# Patient Record
Sex: Female | Born: 1945 | Race: White | Hispanic: No | Marital: Married | Smoking: Never smoker
Health system: Southern US, Community
[De-identification: ages and names within clinical notes are randomized; demographics above are authoritative.]

## PROBLEM LIST (undated history)

## (undated) DIAGNOSIS — F419 Anxiety disorder, unspecified: Secondary | ICD-10-CM

## (undated) DIAGNOSIS — F329 Major depressive disorder, single episode, unspecified: Secondary | ICD-10-CM

## (undated) DIAGNOSIS — K589 Irritable bowel syndrome without diarrhea: Secondary | ICD-10-CM

## (undated) DIAGNOSIS — E041 Nontoxic single thyroid nodule: Secondary | ICD-10-CM

## (undated) DIAGNOSIS — D6800 Von Willebrand disease, unspecified: Secondary | ICD-10-CM

## (undated) DIAGNOSIS — K5790 Diverticulosis of intestine, part unspecified, without perforation or abscess without bleeding: Secondary | ICD-10-CM

## (undated) DIAGNOSIS — R51 Headache: Secondary | ICD-10-CM

## (undated) DIAGNOSIS — J309 Allergic rhinitis, unspecified: Secondary | ICD-10-CM

## (undated) DIAGNOSIS — I341 Nonrheumatic mitral (valve) prolapse: Secondary | ICD-10-CM

## (undated) DIAGNOSIS — K648 Other hemorrhoids: Secondary | ICD-10-CM

## (undated) DIAGNOSIS — M1990 Unspecified osteoarthritis, unspecified site: Secondary | ICD-10-CM

## (undated) DIAGNOSIS — F32A Depression, unspecified: Secondary | ICD-10-CM

## (undated) HISTORY — DX: Diverticulosis of intestine, part unspecified, without perforation or abscess without bleeding: K57.90

## (undated) HISTORY — DX: Von Willebrand disease, unspecified: D68.00

## (undated) HISTORY — DX: Headache: R51

## (undated) HISTORY — DX: Anxiety disorder, unspecified: F41.9

## (undated) HISTORY — DX: Nonrheumatic mitral (valve) prolapse: I34.1

## (undated) HISTORY — DX: Depression, unspecified: F32.A

## (undated) HISTORY — DX: Major depressive disorder, single episode, unspecified: F32.9

## (undated) HISTORY — DX: Irritable bowel syndrome, unspecified: K58.9

## (undated) HISTORY — DX: Von Willebrand's disease: D68.0

## (undated) HISTORY — DX: Unspecified osteoarthritis, unspecified site: M19.90

## (undated) HISTORY — DX: Nontoxic single thyroid nodule: E04.1

## (undated) HISTORY — DX: Other hemorrhoids: K64.8

## (undated) HISTORY — DX: Allergic rhinitis, unspecified: J30.9

---

## 1966-07-24 HISTORY — PX: TRANSRECTAL DRAINAGE OF PELVIC ABSCESS: SUR1387

## 1968-07-24 HISTORY — PX: OTHER SURGICAL HISTORY: SHX169

## 1984-07-24 HISTORY — PX: OTHER SURGICAL HISTORY: SHX169

## 1990-07-24 HISTORY — PX: ABDOMINAL HYSTERECTOMY: SHX81

## 1999-02-01 ENCOUNTER — Encounter: Admission: RE | Admit: 1999-02-01 | Discharge: 1999-02-24 | Payer: Self-pay | Admitting: Family Medicine

## 1999-06-01 ENCOUNTER — Encounter: Payer: Self-pay | Admitting: Family Medicine

## 1999-06-01 ENCOUNTER — Encounter: Admission: RE | Admit: 1999-06-01 | Discharge: 1999-06-01 | Payer: Self-pay | Admitting: Internal Medicine

## 2000-10-29 ENCOUNTER — Other Ambulatory Visit: Admission: RE | Admit: 2000-10-29 | Discharge: 2000-10-29 | Payer: Self-pay | Admitting: Obstetrics and Gynecology

## 2000-10-29 ENCOUNTER — Encounter: Payer: Self-pay | Admitting: Obstetrics and Gynecology

## 2000-10-29 ENCOUNTER — Encounter: Admission: RE | Admit: 2000-10-29 | Discharge: 2000-10-29 | Payer: Self-pay | Admitting: Obstetrics and Gynecology

## 2000-11-09 ENCOUNTER — Encounter: Payer: Self-pay | Admitting: Family Medicine

## 2000-11-09 ENCOUNTER — Encounter: Admission: RE | Admit: 2000-11-09 | Discharge: 2000-11-09 | Payer: Self-pay | Admitting: Family Medicine

## 2001-05-29 ENCOUNTER — Encounter: Admission: RE | Admit: 2001-05-29 | Discharge: 2001-05-29 | Payer: Self-pay | Admitting: Family Medicine

## 2001-05-29 ENCOUNTER — Encounter: Payer: Self-pay | Admitting: Family Medicine

## 2002-04-04 ENCOUNTER — Encounter: Payer: Self-pay | Admitting: Otolaryngology

## 2002-04-04 ENCOUNTER — Ambulatory Visit (HOSPITAL_COMMUNITY): Admission: RE | Admit: 2002-04-04 | Discharge: 2002-04-05 | Payer: Self-pay | Admitting: Otolaryngology

## 2002-04-07 ENCOUNTER — Ambulatory Visit (HOSPITAL_COMMUNITY): Admission: RE | Admit: 2002-04-07 | Discharge: 2002-04-07 | Payer: Self-pay | Admitting: Otolaryngology

## 2003-10-11 ENCOUNTER — Emergency Department (HOSPITAL_COMMUNITY): Admission: EM | Admit: 2003-10-11 | Discharge: 2003-10-11 | Payer: Self-pay | Admitting: Emergency Medicine

## 2004-04-12 ENCOUNTER — Encounter: Admission: RE | Admit: 2004-04-12 | Discharge: 2004-04-12 | Payer: Self-pay | Admitting: Family Medicine

## 2005-07-26 ENCOUNTER — Encounter: Admission: RE | Admit: 2005-07-26 | Discharge: 2005-07-26 | Payer: Self-pay | Admitting: Family Medicine

## 2005-07-27 ENCOUNTER — Emergency Department (HOSPITAL_COMMUNITY): Admission: EM | Admit: 2005-07-27 | Discharge: 2005-07-27 | Payer: Self-pay | Admitting: Family Medicine

## 2007-10-24 ENCOUNTER — Emergency Department (HOSPITAL_COMMUNITY): Admission: EM | Admit: 2007-10-24 | Discharge: 2007-10-24 | Payer: Self-pay | Admitting: Emergency Medicine

## 2008-06-04 ENCOUNTER — Emergency Department (HOSPITAL_COMMUNITY): Admission: EM | Admit: 2008-06-04 | Discharge: 2008-06-04 | Payer: Self-pay | Admitting: Emergency Medicine

## 2010-08-14 ENCOUNTER — Encounter: Payer: Self-pay | Admitting: Family Medicine

## 2010-08-23 ENCOUNTER — Other Ambulatory Visit: Payer: Self-pay | Admitting: Internal Medicine

## 2010-08-23 DIAGNOSIS — Z1239 Encounter for other screening for malignant neoplasm of breast: Secondary | ICD-10-CM

## 2010-08-30 ENCOUNTER — Ambulatory Visit
Admission: RE | Admit: 2010-08-30 | Discharge: 2010-08-30 | Disposition: A | Discharge status: Home / Self Care | Payer: Medicare Other | Source: Ambulatory Visit | Attending: Internal Medicine | Admitting: Internal Medicine

## 2010-08-30 DIAGNOSIS — Z1239 Encounter for other screening for malignant neoplasm of breast: Secondary | ICD-10-CM

## 2010-12-09 NOTE — Op Note (Signed)
NAME:  Teresa Huber, Teresa Huber                        ACCOUNT NO.:  [id number]   MEDICAL RECORD NO.:  [id number]                   PATIENT TYPE:  OIB   LOCATION:  5731                                 FACILITY:  [facility]   PHYSICIAN:  Dorna Leitz, M.D.                 DATE OF BIRTH:  August 01, 1945   DATE OF PROCEDURE:  04/04/2002  DATE OF DISCHARGE:                                 OPERATIVE REPORT   PREOPERATIVE DIAGNOSIS:  Bilateral inferior turbinate hypertrophy.   POSTOPERATIVE DIAGNOSIS:  Bilateral inferior turbinate hypertrophy.   PROCEDURE:  Bilateral inferior turbinate reduction.   SURGEON:  Dorna Leitz, M.D.   ANESTHESIA:  General endotracheal anesthesia.   ESTIMATED BLOOD LOSS:  Less than 10 cc.   SPECIMENS:  None.   COMPLICATIONS:  None.   INDICATIONS:  This patient is a 65 year old female with mild von  Willebrand's disease.  She has failed medical therapy and has persistent  nasal obstruction.  She has been evaluated by Dr. Myna Hidalgo who recommended  DDAVP 30 minutes prior to the procedure, as well as the pack pulling.   FINDINGS:  This patient was noted to have profuse bilateral mucosal and bony  turbinate hypertrophy with nasal obstruction.  There was a mild right  prominent maxillary crest and thickening of the nasal septum which was not  obstructing after the turbinates were reduced.   PROCEDURE:  The patient was taken to the operating room and placed on the  table in the supine position.  She was then placed under general  endotracheal anesthesia and each of the nasal cavities decongested with  Afrin on Cottonoid pledgets.  Each of the inferior turbinates were injected  with 1% lidocaine with 1:100,000 of epinephrine and time allowed for  hemostasis.  At this point, the 0-degree Storz-Hopkins endoscope was used to  perform the turbinectomy.  The microdebrider was used to remove excessive  mucosa.  Redundant bone was taken down using the endoscopic sinus scissors  along the full extent of the inferior turbinate.  Suction cautery was used  to insure  hemostasis.  The intranasal cavities were left patent and the packed with  Merocele packs coated with Bactroban Cream.  The oral cavity was suctioned  and the patient awakened from anesthesia.  She was taken to the post-  anesthesia care unit in stable condition.  There were no complications.                                               Dorna Leitz, M.D.    SLJ/MEDQ  D:  04/04/2002  T:  04/05/2002  Job:  [id number]   cc:   Duwayne Heck L. Mahaffey, M.D.  [address]  [place]  [place] [address]  Fax: [phone]   Rose Phi. Ennever,  M.D.  [address]  [place], [place] [address]  Fax: [phone]   [facility]

## 2011-03-22 ENCOUNTER — Inpatient Hospital Stay (INDEPENDENT_AMBULATORY_CARE_PROVIDER_SITE_OTHER)
Admission: RE | Admit: 2011-03-22 | Discharge: 2011-03-22 | Disposition: A | Discharge status: Home / Self Care | Payer: Medicare Other | Source: Ambulatory Visit | Attending: Family Medicine | Admitting: Family Medicine

## 2011-03-22 DIAGNOSIS — T148XXA Other injury of unspecified body region, initial encounter: Secondary | ICD-10-CM

## 2011-03-22 DIAGNOSIS — IMO0001 Reserved for inherently not codable concepts without codable children: Secondary | ICD-10-CM

## 2011-04-25 LAB — URINALYSIS, ROUTINE W REFLEX MICROSCOPIC
Bilirubin Urine: NEGATIVE
Protein, ur: 30 — AB
Specific Gravity, Urine: 1.029
Urobilinogen, UA: 0.2
pH: 5

## 2011-04-25 LAB — URINE MICROSCOPIC-ADD ON

## 2011-07-20 ENCOUNTER — Other Ambulatory Visit: Payer: Self-pay | Admitting: Family Medicine

## 2011-07-20 NOTE — Telephone Encounter (Signed)
Is this ok?

## 2011-08-08 ENCOUNTER — Other Ambulatory Visit: Payer: Self-pay | Admitting: Family Medicine

## 2011-08-09 NOTE — Telephone Encounter (Signed)
This was in rx request, I can not find that patient has ever been seen here nor can I find a chart. Looks like Cheri refiiled this for her once already on 07/20/11 not sure why. Do you want me to refill?

## 2012-03-21 ENCOUNTER — Telehealth: Payer: Self-pay | Admitting: Oncology

## 2012-03-21 NOTE — Telephone Encounter (Signed)
C/D on 08/29 for appt 9/12

## 2012-03-29 ENCOUNTER — Encounter: Payer: Self-pay | Admitting: Oncology

## 2012-03-29 DIAGNOSIS — D680 Von Willebrand's disease: Secondary | ICD-10-CM | POA: Insufficient documentation

## 2012-04-04 ENCOUNTER — Ambulatory Visit: Payer: Medicare Other

## 2012-04-04 ENCOUNTER — Ambulatory Visit (HOSPITAL_BASED_OUTPATIENT_CLINIC_OR_DEPARTMENT_OTHER): Payer: Medicare Other | Admitting: Oncology

## 2012-04-04 ENCOUNTER — Other Ambulatory Visit (HOSPITAL_BASED_OUTPATIENT_CLINIC_OR_DEPARTMENT_OTHER): Payer: Medicare Other | Admitting: Lab

## 2012-04-04 ENCOUNTER — Encounter: Payer: Self-pay | Admitting: Oncology

## 2012-04-04 VITALS — BP 146/82 | HR 61 | Temp 97.6°F | Resp 20 | Ht 63.5 in | Wt 177.7 lb

## 2012-04-04 DIAGNOSIS — D6800 Von Willebrand disease, unspecified: Secondary | ICD-10-CM

## 2012-04-04 NOTE — Progress Notes (Signed)
[facility]  [phone] [phone]     INITIAL HEMATOLOGY CONSULTATION    Referral MD:  Dr. Shaune Pollack, M.D.  Reason for Referral: history of von Willebrand disease; establishing local hematology.     HPI: Teresa Huber is a 66 year old woman with history of Von Willebrand's disease. She was diagnosed in the 66s when she presented to her local physician in [place] with severe gum bleeding after dental procedures that required packing. She was given a diagnosis of von Willebrand disease. This was with a Dr. Clearance Coots in the 1980s and unfortunately; there is no documented record of this diagnosis and its workup. She has moved around quite a bit. She however did have uterine fibroid and dysfunctional uterine bleeding. She underwent) cyst removal abdominal hysterectomy in 1986 and 1992 respectively. She thinks she did receive cryoprecipitate 3, during and post procedures without any bleeding complication. She also remember receiving DDAVP for another procedure however she doesn't remember which one. She has been to [facility] for the past 2 years. She established primary care with Dr. Kevan Ny.  She may require a screening colonoscopy in the near future with Dr. Carman Ching; therefore, she was kindly referred to the cancer Center for evaluation.  Teresa Huber presented to the clinic for the first time with her husband today.  She has intermittent head ache.  She has intermittent right lower quadrant pain that has been chronic ever since her abdominal surgeries.  The RLQ pain is very mild; worsened with certain body movement; spontaneously improved.  She also has bilateral knee discomfort when she walks a lot.  She otherwise denies spontaneous hemorrhage.   Patient denies fever, anorexia, weight loss, fatigue, headache, visual changes, confusion, drenching night sweats, palpable lymph node swelling, mucositis, odynophagia, dysphagia, nausea  vomiting, jaundice, chest pain, palpitation, shortness of breath, dyspnea on exertion, productive cough, gum bleeding, epistaxis, hematemesis, hemoptysis, abdominal pain, abdominal swelling, early satiety, melena, hematochezia, hematuria, skin rash, spontaneous bleeding, joint swelling, heat or cold intolerance, bowel bladder incontinence, back pain, focal motor weakness, paresthesia, depression, suicidal or homicidal ideation, feeling hopelessness.   Past Medical History  Diagnosis Date  . IBS (irritable bowel syndrome)   . Diverticulosis   . Mitral valve prolapse   . VWD (von Willebrand's disease)     diagnosed in [place] (Dr. Clearance Coots); [facility] (Dr.   . Depression   . Anxiety   . Arthritis   . Headache   . Allergic rhinitis   . Internal hemorrhoids   :    Past Surgical History  Procedure Date  . Benign breast cyst removal 1970  . Right ovarian cyst removal 1986    was on cryo (day before, of, and day after)   . Abdominal hysterectomy 1992    was on cryo (day before, of, and day after)   . Transrectal drainage of pelvic abscess 1968  :   CURRENT MEDS: Current Outpatient Prescriptions  Medication Sig Dispense Refill  . Ascorbic Acid (VITAMIN C) 1000 MG tablet Take 1,000 mg by mouth daily.      Marland Kitchen aspirin 81 MG chewable tablet Chew 81 mg by mouth daily.      . B Complex Vitamins (VITAMIN B COMPLEX PO) Take by mouth 3 (three) times a week.      Marland Kitchen BIOTIN FORTE PO Take by mouth 2 (two) times a week.      . calcium carbonate 1250 MG capsule Take 1,250 mg by mouth daily.      Marland Kitchen  cetirizine (ZYRTEC) 10 MG tablet Take 10 mg by mouth daily.      . Cholecalciferol (VITAMIN D-3 PO) Take by mouth daily.      . citalopram (CELEXA) 20 MG tablet Take 10 mg by mouth daily.      . fish oil-omega-3 fatty acids 1000 MG capsule Take 2 g by mouth 3 (three) times a week.      Marland Kitchen glucosamine-chondroitin 500-400 MG tablet Take 1 tablet by mouth daily.      Marland Kitchen  isometheptene-acetaminophen-dichloralphenazone (MIDRIN) 65-325-100 MG capsule TAKE ONE CAPSULE BY MOUTH EVERY 4 HOURS AS NEEDED FOR HEADACHE  20 capsule  0      Allergies  Allergen Reactions  . Betadine (Povidone Iodine) Itching  . Ether Other (See Comments)    blisters  . Iodine Itching  . Red Dye Itching    All Red dyes cause severe itching  . Novocain (Procaine) Rash  :  Family History  Problem Relation Age of Onset  . Heart disease Mother   . Cancer Father     mesothelioma  . Evelene Croon Parkinson White syndrome Brother   . Cancer Maternal Aunt 8    breast or ovarian  . Cancer Maternal Aunt     ovarian  :  History   Social History  . Marital Status: Married    Spouse Name: N/A    Number of Children: 2  . Years of Education: N/A   Occupational History  .      retired Psychologist, sport and exercise.    Social History Main Topics  . Smoking status: Never Smoker   . Smokeless tobacco: Never Used  . Alcohol Use: No  . Drug Use: No  . Sexually Active:    Other Topics Concern  . Not on file   Social History Narrative  . No narrative on file  :  REVIEW OF SYSTEM:  The rest of the 14-point review of sytem was negative.   Exam: ECOG 0.   General:  well-nourished woman, in no acute distress.  Eyes:  no scleral icterus.  ENT:  There were no oropharyngeal lesions.  Neck was without thyromegaly.  Lymphatics:  Negative cervical, supraclavicular or axillary adenopathy.  Respiratory: lungs were clear bilaterally without wheezing or crackles.  Cardiovascular:  Regular rate and rhythm, S1/S2, without murmur, rub or gallop.  There was no pedal edema.  GI:  abdomen was soft, flat, nontender, nondistended, without organomegaly.  Muscoloskeletal:  no spinal tenderness of palpation of vertebral spine.  Skin exam was without echymosis, petichae.  Neuro exam was nonfocal.  Patient was able to get on and off exam table without assistance.  Gait was normal.  Patient was alerted and oriented.  Attention  was good.   Language was appropriate.  Mood was normal without depression.  Speech was not pressured.  Thought content was not tangential.    LABS:  Pending.    ASSESSMENT AND PLAN:   1.  History of von Willebrand disease: I need to confirm this diagnosis by restesting since patient does not have any documentation of her diagnosis and I could not locate her original hematologist for record from 30 years ago.  2.  If diagnosis of vWD is confirmed, patient will need prophylactic dDAVP or Humate depending on risk of procedure.  She is due to have a screening colonoscopy.  This is at low risk of bleeding (for a screening procedure). DDAVP would be sufficient.  Dosing of dDAVP is 0.3mg /kg IV right before procedure.  If she  has biopsy or evidence of bleeding, she will need post biopsy dDAVP as well.  3.  Follow up:  Return to clinic the morning of colonoscopy for dDAVP (if diagnosis is confirmed).  4.  Patient education:  I advised patient to contact us whenever she has an invasive procedure (dental extraction, surgery, biopsy) to receive prophylactic dDAVP.   5.  Follow up:  Prn.      Thank you for this referral.    The length of time of the face-to-face encounter was 30 minutes. More than 50% of time was spent counseling and coordination of care.

## 2012-04-04 NOTE — Patient Instructions (Addendum)
1.  History of von Willebrand disease: I need to confirm this diagnosis with the previous hematologist or if not possible; carry out my own testing here. 2.  If diagnosis is confirmed, patient will need prophylactic dDAVP or Humate depending on risk of procedure.  She is due to have a screening colonoscopy.  This is at low risk of bleeding (for a screening procedure).   Dosing of dDAVP with pending on the finding and intervention at colonoscopy.  3.  Follow up:  Return to clinic the morning of colonoscopy

## 2012-04-11 ENCOUNTER — Encounter: Payer: Self-pay | Admitting: Oncology

## 2012-04-17 ENCOUNTER — Encounter: Payer: Self-pay | Admitting: *Deleted

## 2012-04-17 ENCOUNTER — Encounter: Payer: Self-pay | Admitting: Oncology

## 2012-04-17 NOTE — Progress Notes (Signed)
Faxed letter from Dr. Gaylyn Rong to Dr. Randa Evens at fax [phone].

## 2012-05-01 ENCOUNTER — Telehealth: Payer: Self-pay | Admitting: *Deleted

## 2012-05-01 NOTE — Telephone Encounter (Signed)
Pt left a VM to inform Dr. Gaylyn Rong of her MD in [place] is named Isaac Bliss and his office number is [phone].  This is the MD who diagnosed pt's Von Willebrand Disease in 1982. He is still in practice.

## 2012-05-02 ENCOUNTER — Telehealth: Payer: Self-pay | Admitting: *Deleted

## 2012-05-02 NOTE — Telephone Encounter (Signed)
She needs to sign the release of record for [place] to get the information.  Can she fax [place] back the form?  Thanks.

## 2012-05-02 NOTE — Telephone Encounter (Signed)
Called pt to arrange to have her sign Release of Information to request records from Dr. Vickie Epley in [place].  Dr. Gaylyn Rong requested tests that confirm diagnosis of Von Willebrand Disease.  Pt states all her records at that Practice have been destroyed, so no point to request them.  She says Dr. Gaylyn Rong can call Dr. Vickie Epley at [facility] #[phone], if needed.    She says her colonscopy is scheduled for Oct 23 rd by Dr. Randa Evens at [facility].  She asks if Dr. Gaylyn Rong thinks she should have DDAVP prior to procedure?

## 2012-05-02 NOTE — Telephone Encounter (Signed)
Called pt and confirmed she did receive Dr. Lodema Pilot letter and understands since she does not have any evidence of VWD currently,  No need for DDAVP.  She verbalized understanding.

## 2012-05-02 NOTE — Telephone Encounter (Signed)
No ddavp.  Please see last letter that I sent out to her.  She has no evidence of vWD here.  Thanks.

## 2012-05-09 ENCOUNTER — Other Ambulatory Visit: Payer: Self-pay | Admitting: Family Medicine

## 2012-05-09 DIAGNOSIS — Z1231 Encounter for screening mammogram for malignant neoplasm of breast: Secondary | ICD-10-CM

## 2012-05-15 LAB — HM COLONOSCOPY

## 2012-06-14 ENCOUNTER — Inpatient Hospital Stay: Admission: RE | Admit: 2012-06-14 | Payer: Medicare Other | Source: Ambulatory Visit

## 2013-02-09 ENCOUNTER — Emergency Department (HOSPITAL_COMMUNITY): Payer: Medicare Other

## 2013-02-09 ENCOUNTER — Encounter (HOSPITAL_COMMUNITY): Payer: Self-pay | Admitting: Emergency Medicine

## 2013-02-09 ENCOUNTER — Emergency Department (HOSPITAL_COMMUNITY)
Admission: EM | Admit: 2013-02-09 | Discharge: 2013-02-09 | Disposition: A | Discharge status: Home / Self Care | Payer: Medicare Other | Attending: Emergency Medicine | Admitting: Emergency Medicine

## 2013-02-09 DIAGNOSIS — Z8719 Personal history of other diseases of the digestive system: Secondary | ICD-10-CM | POA: Insufficient documentation

## 2013-02-09 DIAGNOSIS — F329 Major depressive disorder, single episode, unspecified: Secondary | ICD-10-CM | POA: Insufficient documentation

## 2013-02-09 DIAGNOSIS — Z7982 Long term (current) use of aspirin: Secondary | ICD-10-CM | POA: Insufficient documentation

## 2013-02-09 DIAGNOSIS — R6883 Chills (without fever): Secondary | ICD-10-CM | POA: Insufficient documentation

## 2013-02-09 DIAGNOSIS — M129 Arthropathy, unspecified: Secondary | ICD-10-CM | POA: Insufficient documentation

## 2013-02-09 DIAGNOSIS — F3289 Other specified depressive episodes: Secondary | ICD-10-CM | POA: Insufficient documentation

## 2013-02-09 DIAGNOSIS — Z791 Long term (current) use of non-steroidal anti-inflammatories (NSAID): Secondary | ICD-10-CM | POA: Insufficient documentation

## 2013-02-09 DIAGNOSIS — R109 Unspecified abdominal pain: Secondary | ICD-10-CM

## 2013-02-09 DIAGNOSIS — M549 Dorsalgia, unspecified: Secondary | ICD-10-CM | POA: Insufficient documentation

## 2013-02-09 DIAGNOSIS — Z8709 Personal history of other diseases of the respiratory system: Secondary | ICD-10-CM | POA: Insufficient documentation

## 2013-02-09 DIAGNOSIS — R110 Nausea: Secondary | ICD-10-CM | POA: Insufficient documentation

## 2013-02-09 DIAGNOSIS — F411 Generalized anxiety disorder: Secondary | ICD-10-CM | POA: Insufficient documentation

## 2013-02-09 DIAGNOSIS — N200 Calculus of kidney: Secondary | ICD-10-CM

## 2013-02-09 DIAGNOSIS — Z79899 Other long term (current) drug therapy: Secondary | ICD-10-CM | POA: Insufficient documentation

## 2013-02-09 DIAGNOSIS — Z87828 Personal history of other (healed) physical injury and trauma: Secondary | ICD-10-CM | POA: Insufficient documentation

## 2013-02-09 DIAGNOSIS — Z862 Personal history of diseases of the blood and blood-forming organs and certain disorders involving the immune mechanism: Secondary | ICD-10-CM | POA: Insufficient documentation

## 2013-02-09 LAB — URINALYSIS, ROUTINE W REFLEX MICROSCOPIC
Nitrite: POSITIVE — AB
Protein, ur: NEGATIVE mg/dL

## 2013-02-09 LAB — CBC WITH DIFFERENTIAL/PLATELET
Eosinophils Absolute: 0.2 10*3/uL (ref 0.0–0.7)
Eosinophils Relative: 3 % (ref 0–5)
Lymphocytes Relative: 14 % (ref 12–46)
MCH: 30.4 pg (ref 26.0–34.0)
MCHC: 34.3 g/dL (ref 30.0–36.0)
Monocytes Absolute: 0.4 10*3/uL (ref 0.1–1.0)
Monocytes Relative: 5 % (ref 3–12)
Neutro Abs: 5.6 10*3/uL (ref 1.7–7.7)
Neutrophils Relative %: 78 % — ABNORMAL HIGH (ref 43–77)
Platelets: 170 10*3/uL (ref 150–400)
RBC: 4.51 MIL/uL (ref 3.87–5.11)
WBC: 7.3 10*3/uL (ref 4.0–10.5)

## 2013-02-09 LAB — COMPREHENSIVE METABOLIC PANEL
ALT: 16 U/L (ref 0–35)
Albumin: 4 g/dL (ref 3.5–5.2)
Calcium: 10.7 mg/dL — ABNORMAL HIGH (ref 8.4–10.5)
Creatinine, Ser: 0.95 mg/dL (ref 0.50–1.10)
GFR calc non Af Amer: 61 mL/min — ABNORMAL LOW (ref >90)
Sodium: 140 mEq/L (ref 135–145)
Total Bilirubin: 0.4 mg/dL (ref 0.3–1.2)
Total Protein: 7.1 g/dL (ref 6.0–8.3)

## 2013-02-09 LAB — URINE MICROSCOPIC-ADD ON

## 2013-02-09 MED ORDER — CEPHALEXIN 500 MG PO CAPS: 500.0000 mg | ORAL_CAPSULE | Freq: Two times a day (BID) | ORAL | Status: DC

## 2013-02-09 MED ORDER — HYDROCODONE-ACETAMINOPHEN 5-325 MG PO TABS: 1.0000 | ORAL_TABLET | ORAL | Status: DC | PRN

## 2013-02-09 MED ORDER — TAMSULOSIN HCL 0.4 MG PO CAPS: 0.4000 mg | ORAL_CAPSULE | Freq: Every day | ORAL | Status: DC

## 2013-02-09 MED ORDER — MORPHINE SULFATE 4 MG/ML IJ SOLN
4.0000 mg | Freq: Once | INTRAMUSCULAR | Status: AC
  Administered 2013-02-09: 4 mg via INTRAVENOUS
  Filled 2013-02-09: qty 1

## 2013-02-09 MED ORDER — ONDANSETRON 4 MG PO TBDP: ORAL_TABLET | ORAL | Status: DC

## 2013-02-09 MED ORDER — FENTANYL CITRATE 0.05 MG/ML IJ SOLN
50.0000 ug | Freq: Once | INTRAMUSCULAR | Status: AC
  Administered 2013-02-09: 50 ug via INTRAVENOUS
  Filled 2013-02-09: qty 2

## 2013-02-09 MED ORDER — SODIUM CHLORIDE 0.9 % IV BOLUS (SEPSIS)
1000.0000 mL | Freq: Once | INTRAVENOUS | Status: AC
  Administered 2013-02-09: 1000 mL via INTRAVENOUS

## 2013-02-09 MED ORDER — KETOROLAC TROMETHAMINE 15 MG/ML IJ SOLN
15.0000 mg | Freq: Once | INTRAMUSCULAR | Status: AC
  Administered 2013-02-09: 15 mg via INTRAVENOUS
  Filled 2013-02-09: qty 1

## 2013-02-09 MED ORDER — ONDANSETRON HCL 4 MG/2ML IJ SOLN
4.0000 mg | Freq: Once | INTRAMUSCULAR | Status: AC
  Administered 2013-02-09: 4 mg via INTRAVENOUS
  Filled 2013-02-09: qty 2

## 2013-02-09 NOTE — ED Provider Notes (Signed)
History    CSN: [id number] Arrival date & time 02/09/13  0809  First MD Initiated Contact with Patient 02/09/13 0815     Chief Complaint  Patient presents with  . Flank Pain   (Consider location/radiation/quality/duration/timing/severity/associated sxs/prior Treatment) HPI Comments: 67 yo female with kidney stone hx, hysterectomy presents with constant right flank pain.  Pt had clinical UTI diagnosed on Thursday due to pain and frequency.  She has taken cipro as directed and she has developed worsening right groin pain radiating to flank/ back, similar to stone hx.  No fevers, mild chills.  Mild nausea.  No AAA hx known.  No known gb issues.  Sharp/ Lloyd Huger.  Patient is a 67 y.o. female presenting with flank pain. The history is provided by the patient.  Flank Pain This is a recurrent problem. The problem occurs constantly. Pertinent negatives include no chest pain, no abdominal pain, no headaches and no shortness of breath.   Past Medical History  Diagnosis Date  . IBS (irritable bowel syndrome)   . Diverticulosis   . Mitral valve prolapse   . VWD (von Willebrand's disease)     diagnosed in [place] (Dr. Clearance Coots); [facility] (Dr.   . Depression   . Anxiety   . Arthritis   . Headache(784.0)   . Allergic rhinitis   . Internal hemorrhoids    Past Surgical History  Procedure Laterality Date  . Benign breast cyst removal  1970  . Right ovarian cyst removal  1986    was on cryo (day before, of, and day after)   . Abdominal hysterectomy  1992    was on cryo (day before, of, and day after)   . Transrectal drainage of pelvic abscess  1968   Family History  Problem Relation Age of Onset  . Heart disease Mother   . Cancer Father     mesothelioma  . Evelene Croon Parkinson White syndrome Brother   . Cancer Maternal Aunt 55    breast or ovarian  . Cancer Maternal Aunt     ovarian   History  Substance Use Topics  . Smoking status: Never Smoker   . Smokeless tobacco: Never Used   . Alcohol Use: No   OB History   Grav Para Term Preterm Abortions TAB SAB Ect Mult Living                 Review of Systems  Constitutional: Positive for chills and appetite change. Negative for fever.  HENT: Negative for neck pain and neck stiffness.   Eyes: Negative for visual disturbance.  Respiratory: Negative for shortness of breath.   Cardiovascular: Negative for chest pain.  Gastrointestinal: Positive for nausea. Negative for vomiting and abdominal pain.  Genitourinary: Positive for flank pain.  Musculoskeletal: Positive for back pain.  Skin: Negative for rash.  Neurological: Negative for light-headedness and headaches.    Allergies  Betadine; Ether; Iodine; Red dye; and Novocain  Home Medications   Current Outpatient Rx  Name  Route  Sig  Dispense  Refill  . Ascorbic Acid (VITAMIN C) 1000 MG tablet   Oral   Take 1,000 mg by mouth daily.         Marland Kitchen aspirin 81 MG chewable tablet   Oral   Chew 81 mg by mouth daily.         . B Complex Vitamins (VITAMIN B COMPLEX PO)   Oral   Take by mouth 3 (three) times a week.         Marland Kitchen  BIOTIN FORTE PO   Oral   Take by mouth 2 (two) times a week.         . calcium carbonate 1250 MG capsule   Oral   Take 1,250 mg by mouth daily.         . cetirizine (ZYRTEC) 10 MG tablet   Oral   Take 10 mg by mouth daily.         . Cholecalciferol (VITAMIN D-3 PO)   Oral   Take by mouth daily.         . citalopram (CELEXA) 20 MG tablet   Oral   Take 10 mg by mouth daily.         . fish oil-omega-3 fatty acids 1000 MG capsule   Oral   Take 2 g by mouth 3 (three) times a week.         Marland Kitchen glucosamine-chondroitin 500-400 MG tablet   Oral   Take 1 tablet by mouth daily.         Marland Kitchen isometheptene-acetaminophen-dichloralphenazone (MIDRIN) 65-325-100 MG capsule      TAKE ONE CAPSULE BY MOUTH EVERY 4 HOURS AS NEEDED FOR HEADACHE   20 capsule   0    BP 147/79  Pulse 69  Temp(Src) 97.9 F (36.6 C) (Oral)   Resp 1  SpO2 97% Physical Exam  Nursing note and vitals reviewed. Constitutional: She is oriented to person, place, and time. She appears well-developed and well-nourished.  HENT:  Head: Normocephalic and atraumatic.  Eyes: Conjunctivae are normal. Right eye exhibits no discharge. Left eye exhibits no discharge.  Neck: Normal range of motion. Neck supple. No tracheal deviation present.  Cardiovascular: Normal rate and regular rhythm.   Pulmonary/Chest: Effort normal and breath sounds normal.  Abdominal: Soft. She exhibits no distension. There is tenderness (mild lower right flank). There is no guarding.  Musculoskeletal: She exhibits tenderness (right lower flank moderate). She exhibits no edema.  Neurological: She is alert and oriented to person, place, and time.  Skin: Skin is warm. No rash noted.  Psychiatric: She has a normal mood and affect.    ED Course  Procedures (including critical care time) EMERGENCY DEPARTMENT ULTRASOUND  Study: Limited Retroperitoneal/ Abdominal Ultrasound of the Abdominal Aorta.  INDICATIONS:Back pain Indication: Multiple views of the abdominal aorta are obtained from the diaphragmatic hiatus to the aortic bifurcation in transverse planes with a multi- Frequency probe.  PERFORMED BY: Myself  IMAGES ARCHIVED?: Yes  FINDINGS: Free fluid absent max diameter 2.2 cm  LIMITATIONS:  Bowel gas  INTERPRETATION:  No abdominal aortic aneurysm  Emergency Focused Ultrasound Exam: Limited abdomen of kidneys and bladder Indication: flank pain Focused abdominal ultrasound with kidneys imaged in transverse and longitudinal planes with bladder visualized in transverse plane. Interpretation:right mild to moderate hydronephrosis visualized. No stones visualized  Images stored on the machine.  Labs Reviewed  CBC WITH DIFFERENTIAL - Abnormal; Notable for the following:    Neutrophils Relative % 78 (*)    All other components within normal limits  COMPREHENSIVE  METABOLIC PANEL  URINALYSIS, ROUTINE W REFLEX MICROSCOPIC   No results found. No diagnosis found.  MDM   Right flank pain in older female.  Bedside US brought in to rule out AAA and to look for hydro. Right hydro.  Concern for kidney stone and with pain/ age CT recommended to clarify details and for urology outpt fup.  Pain improved on recheck, constant mild ache, toradol ordered.  CT showed two small stones 2 and 3  mm, one in bladder. Close fup discussed. Well appearing on dc. Ct Abdomen Pelvis Wo Contrast  02/09/2013   *RADIOLOGY REPORT*  Clinical Data: Flank pain, history of right kidney stone  CT ABDOMEN AND PELVIS WITHOUT CONTRAST  Technique:  Multidetector CT imaging of the abdomen and pelvis was performed following the standard protocol without intravenous contrast.  Comparison: CT abdomen/pelvis 06/04/2008  Findings:  Lower Chest:  Mild dependent atelectasis in the lower lungs.  Trace ground-glass attenuation opacity in the anterior inferior left lower lobe could represent early infiltrate.  Mild cardiomegaly with biatrial enlargement which is incompletely imaged.  No large pericardial effusion.  Unremarkable appearance of the distal thoracic esophagus.  Abdomen: Unenhanced CT was performed per clinician order.  Lack of IV contrast limits sensitivity and specificity, especially for evaluation of abdominal/pelvic solid viscera.  Within these limitations, unremarkable CT appearance of the stomach, duodenum, spleen, adrenal glands and pancreas.  Normal hepatic contour and morphology.  No focal hepatic lesion.  Relative high attenuation around the gallbladder fossa consistent with focal sparing in the setting of hepatic steatosis. Gallbladder is unremarkable. No intra or extrahepatic biliary ductal dilatation.  Mild right hydroureteronephrosis.  Periureteral stranding is identified in the proximal and mid segments of the ureter. Punctate nonobstructing stone in the upper pole collecting system  of the right kidney.  No definite left nephrolithiasis.  No left hydronephrosis.  Normal-caliber large and small bowel throughout the abdomen.  No evidence of obstruction or focal bowel wall thickening.  Diffuse pan colonic diverticulosis without evidence of active inflammation. No free fluid or suspicious adenopathy.  Pelvis: There are too small stones in the region of the right UVJ which measures three and 2 mm respectively.  The larger stone may be just past and within the bladder lumen itself.  The smaller stone is likely still within the UVJ.  Surgical changes of prior hysterectomy.  No free fluid or suspicious adenopathy.  Bones: No acute fracture or aggressive appearing lytic or blastic osseous lesion.  Multilevel degenerative disc disease.  Vascular: Limited evaluation in the absence of intravenous contrast material.  Scattered atherosclerotic vascular calcifications without aneurysmal dilatation.  IMPRESSION:  1.  Two partially obstructing right UVJ stones.  A 3 mm stone has likely passed into the bladder lumen, while a 2 mm stone is still within the UVJ.  There is resultant mild right hydroureteronephrosis. 2.  Additional punctate nonobstructing right renal calculus. 3.  Hepatic steatosis 4.  Atherosclerotic vascular disease 5.  Pan colonic diverticulosis without evidence of active inflammation.   Original Report Authenticated By: Malachy Moan, M.D.     Enid Skeens, MD 02/09/13 1106

## 2013-02-09 NOTE — ED Notes (Signed)
Pt aware of the need for a urine sample. 

## 2013-02-09 NOTE — ED Notes (Signed)
Pt c/o right sided flank pain. States she went to doc and got treated for UTI and now states the sensation in bladder is back and a aching pain in right side. Hx of kidney stones.

## 2013-02-10 LAB — URINE CULTURE

## 2013-03-24 LAB — HM COLONOSCOPY

## 2013-06-06 ENCOUNTER — Other Ambulatory Visit: Payer: Self-pay

## 2013-06-06 DIAGNOSIS — Z1231 Encounter for screening mammogram for malignant neoplasm of breast: Secondary | ICD-10-CM

## 2013-06-17 ENCOUNTER — Encounter: Payer: Self-pay | Admitting: Internal Medicine

## 2013-06-17 ENCOUNTER — Telehealth: Payer: Self-pay | Admitting: Hematology and Oncology

## 2013-06-17 ENCOUNTER — Ambulatory Visit (INDEPENDENT_AMBULATORY_CARE_PROVIDER_SITE_OTHER): Payer: Medicare Other | Admitting: Internal Medicine

## 2013-06-17 VITALS — BP 132/80 | HR 61 | Temp 98.5°F | Ht 64.5 in | Wt 179.2 lb

## 2013-06-17 DIAGNOSIS — E041 Nontoxic single thyroid nodule: Secondary | ICD-10-CM

## 2013-06-17 DIAGNOSIS — D680 Von Willebrand's disease: Secondary | ICD-10-CM

## 2013-06-17 DIAGNOSIS — F411 Generalized anxiety disorder: Secondary | ICD-10-CM | POA: Insufficient documentation

## 2013-06-17 DIAGNOSIS — M1710 Unilateral primary osteoarthritis, unspecified knee: Secondary | ICD-10-CM

## 2013-06-17 MED ORDER — HYDROCODONE-ACETAMINOPHEN 5-325 MG PO TABS: 1.0000 | ORAL_TABLET | Freq: Three times a day (TID) | ORAL | Status: AC | PRN

## 2013-06-17 NOTE — Progress Notes (Signed)
Subjective:    Patient ID: Teresa Huber, female    DOB: 1946-03-29, 67 y.o.   MRN: [id number]  HPI  New patient to me, here to establish with new PCP - prev at [facility] Reviewed chronic medical issues:  Thyroid nodules - reports following annually with endo for same but requests new female provider - no dysphagia or unexpected weight changes - never on rx med or suggested ablation/surg on same  OA - knees, R hip and hands - follows with ortho for same - ([facility]) - no need for replacement at this time per pt report, but pain not always controlled with OTC NSAIDs or Tylenol - no swelling but increasing R groin pain which interferes with ambulation/activity. Denies trauma or recent falls  Anxiety - exacerbated by irritability due to hot flashes - remotely on HRT for same, but stopped >11yr ago - on celexa for same, but not well controlled, esp nocturnal symptoms - ?resume HRT as prior Effexor trial did not help med symptoms - denies si/hi or increasing depression (except due to pain)  vWB disease? 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 IBS (irritable bowel syndrome)   . Diverticulosis   . Mitral valve prolapse   . VWD (von Willebrand's disease)     diagnosed in [place] (Dr. Clearance Coots); [facility] (Dr.   . Depression   . Anxiety   . Arthritis   . Headache(784.0)   . Allergic rhinitis   . Internal hemorrhoids   . Thyroid nodule    Family History  Problem Relation Age of Onset  . Heart disease Mother   . Cancer Father     mesothelioma  . Evelene Croon Parkinson White syndrome Brother   . Cancer Maternal Aunt 48    breast or ovarian  . Cancer Maternal Aunt     ovarian   History  Substance Use Topics  . Smoking status:  Never Smoker   . Smokeless tobacco: Never Used  . Alcohol Use: No    Review of Systems  Constitutional: Negative for fatigue and unexpected weight change.  Respiratory: Negative for cough, shortness of breath and wheezing.   Cardiovascular: Negative for chest pain, palpitations and leg swelling.  Gastrointestinal: Negative for nausea, abdominal pain and diarrhea.  Musculoskeletal: Positive for arthralgias.  Neurological: Negative for dizziness, weakness, light-headedness and headaches.  Hematological: Does not bruise/bleed easily.  Psychiatric/Behavioral: Negative for dysphoric mood. The patient is not nervous/anxious.   All other systems reviewed and are negative.       Objective:   Physical Exam  BP 132/80  Pulse 61  Temp(Src) 98.5 F (36.9 C) (Oral)  Ht 5' 4.5" (1.638 m)  Wt 179 lb 3.2 oz (81.285 kg)  BMI 30.30 kg/m2  SpO2 97% Wt Readings from Last 3 Encounters:  06/17/13 179 lb 3.2 oz (81.285 kg)  04/04/12 177 lb 11.2 oz (80.604 kg)   Constitutional: She is obese, but appears well-developed and well-nourished. No distress.  HENT: Head: Normocephalic and atraumatic. Ears: B TMs ok, no erythema or effusion; Nose: Nose normal. Mouth/Throat: Oropharynx is clear and moist. No oropharyngeal exudate.  Eyes: Conjunctivae and EOM are normal. Pupils are equal, round, and reactive to light. No scleral icterus.  Neck: Normal range of motion. Neck  supple. No JVD present. No thyromegaly present - no tender andno appreciable nodules.  Cardiovascular: Normal rate, regular rhythm and normal heart sounds.  No murmur heard. No BLE edema. Pulmonary/Chest: Effort normal and breath sounds normal. No respiratory distress. She has no wheezes.  Abdominal: Soft. Bowel sounds are normal. She exhibits no distension. There is no tenderness. no masses Musculoskeletal: Normal range of motion, no joint effusions. No gross deformities Neurological: She is alert and oriented to person, place, and time.  No cranial nerve deficit. Coordination, balance, strength, speech and gait are normal.  Skin: Skin is warm and dry. No rash noted. No erythema.  Psychiatric: She has a normal mood and affect. Her behavior is normal. Judgment and thought content normal.   Lab Results  Component Value Date   WBC 7.3 02/09/2013   HGB 13.7 02/09/2013   HCT 39.9 02/09/2013   PLT 170 02/09/2013   GLUCOSE 158* 02/09/2013   ALT 16 02/09/2013   AST 18 02/09/2013   NA 140 02/09/2013   K 3.9 02/09/2013   CL 105 02/09/2013   CREATININE 0.95 02/09/2013   BUN 20 02/09/2013   CO2 26 02/09/2013        Assessment & Plan:   See problem list. Medications and labs reviewed today.  Time spent with pt today 45 minutes, greater than 50% time spent counseling patient on OA concerns, anxiety exacrbated by hot flashes, depression and medication review. Also review of prior records, need for ROI from prior PCP and referral coordination as requested

## 2013-06-17 NOTE — Assessment & Plan Note (Signed)
Follows with ortho for same - Also R hip pain Daily symptoms, inconsistently relieved with OTC meds Reports significant relief with hydrocodone as rx'd 01/2013 for kidney stone symptoms Ok for limited # norco to use as needed for severe symptoms unrelieved by OTC meds follow up ortho as ongoing

## 2013-06-17 NOTE — Patient Instructions (Addendum)
It was good to see you today.  We have reviewed your prior records including labs and tests today  we will send to your prior provider(s) for "release of records" as discussed today.  Medications reviewed and updated, use hydrocodone as needed for arthritis pains when severe -no other changes recommended at this time.  Your prescription(s) have been given to you to submit to your pharmacy. Please take as directed and contact our office if you believe you are having problem(s) with the medication(s).  we'll make referral to Dr Soundra Pilon for endocrinology and female hematologist as requested . Our office will contact you regarding appointment(s) once made.  Continue working with your other specialists as reviewed today  Please schedule followup in 3-4 months for continued review, call sooner if problems.  Arthritis, Nonspecific Arthritis is inflammation of a joint. This usually means pain, redness, warmth or swelling are present. One or more joints may be involved. There are a number of types of arthritis. Your caregiver may not be able to tell what type of arthritis you have right away. CAUSES  The most common cause of arthritis is the wear and tear on the joint (osteoarthritis). This causes damage to the cartilage, which can break down over time. The knees, hips, back and neck are most often affected by this type of arthritis. Other types of arthritis and common causes of joint pain include:  Sprains and other injuries near the joint. Sometimes minor sprains and injuries cause pain and swelling that develop hours later.  Rheumatoid arthritis. This affects hands, feet and knees. It usually affects both sides of your body at the same time. It is often associated with chronic ailments, fever, weight loss and general weakness.  Crystal arthritis. Gout and pseudo gout can cause occasional acute severe pain, redness and swelling in the foot, ankle, or knee.  Infectious arthritis. Bacteria can get  into a joint through a break in overlying skin. This can cause infection of the joint. Bacteria and viruses can also spread through the blood and affect your joints.  Drug, infectious and allergy reactions. Sometimes joints can become mildly painful and slightly swollen with these types of illnesses. SYMPTOMS   Pain is the main symptom.  Your joint or joints can also be red, swollen and warm or hot to the touch.  You may have a fever with certain types of arthritis, or even feel overall ill.  The joint with arthritis will hurt with movement. Stiffness is present with some types of arthritis. DIAGNOSIS  Your caregiver will suspect arthritis based on your description of your symptoms and on your exam. Testing may be needed to find the type of arthritis:  Blood and sometimes urine tests.  X-ray tests and sometimes CT or MRI scans.  Removal of fluid from the joint (arthrocentesis) is done to check for bacteria, crystals or other causes. Your caregiver (or a specialist) will numb the area over the joint with a local anesthetic, and use a needle to remove joint fluid for examination. This procedure is only minimally uncomfortable.  Even with these tests, your caregiver may not be able to tell what kind of arthritis you have. Consultation with a specialist (rheumatologist) may be helpful. TREATMENT  Your caregiver will discuss with you treatment specific to your type of arthritis. If the specific type cannot be determined, then the following general recommendations may apply. Treatment of severe joint pain includes:  Rest.  Elevation.  Anti-inflammatory medication (for example, ibuprofen) may be prescribed. Avoiding activities  that cause increased pain.  Only take over-the-counter or prescription medicines for pain and discomfort as recommended by your caregiver.  Cold packs over an inflamed joint may be used for 10 to 15 minutes every hour. Hot packs sometimes feel better, but do not use  overnight. Do not use hot packs if you are diabetic without your caregiver's permission.  A cortisone shot into arthritic joints may help reduce pain and swelling.  Any acute arthritis that gets worse over the next 1 to 2 days needs to be looked at to be sure there is no joint infection. Long-term arthritis treatment involves modifying activities and lifestyle to reduce joint stress jarring. This can include weight loss. Also, exercise is needed to nourish the joint cartilage and remove waste. This helps keep the muscles around the joint strong. HOME CARE INSTRUCTIONS   Do not take aspirin to relieve pain if gout is suspected. This elevates uric acid levels.  Only take over-the-counter or prescription medicines for pain, discomfort or fever as directed by your caregiver.  Rest the joint as much as possible.  If your joint is swollen, keep it elevated.  Use crutches if the painful joint is in your leg.  Drinking plenty of fluids may help for certain types of arthritis.  Follow your caregiver's dietary instructions.  Try low-impact exercise such as:  Swimming.  Water aerobics.  Biking.  Walking.  Morning stiffness is often relieved by a warm shower.  Put your joints through regular range-of-motion. SEEK MEDICAL CARE IF:   You do not feel better in 24 hours or are getting worse.  You have side effects to medications, or are not getting better with treatment. SEEK IMMEDIATE MEDICAL CARE IF:   You have a fever.  You develop severe joint pain, swelling or redness.  Many joints are involved and become painful and swollen.  There is severe back pain and/or leg weakness.  You have loss of bowel or bladder control. Document Released: 08/17/2004 Document Revised: 10/02/2011 Document Reviewed: 09/02/2008 [facility] Patient Information 2014 [place], [place].

## 2013-06-17 NOTE — Progress Notes (Signed)
Pre-visit discussion using our clinic review tool. No additional management support is needed unless otherwise documented below in the visit note.  

## 2013-06-17 NOTE — Assessment & Plan Note (Signed)
Exacerbated by PMP state and hot flashes Overlap with depression - exacerbated by spouse's declining health On celexa - prior Effexor trial caused in BP and remote Prozac prior to SNRI for same As symptoms "stable" will work on management of severe OA pain spells as above i advised against resuming HRT at this time given potential risks>benefit If symptoms continue to escalate, consider alternate SNRI/SSRI but pt declines desire for change at this time Support offered at length

## 2013-06-17 NOTE — Assessment & Plan Note (Signed)
Refer to endo as requested Request ROI - reports labs and US done within past 56mo

## 2013-06-17 NOTE — Telephone Encounter (Signed)
LVOM FOR PT TO RETURN CALL IN RE TO REFERRAL.  °

## 2013-06-17 NOTE — Assessment & Plan Note (Signed)
Local heme eval by Gaylyn Rong 03/2012 reviewed in EMR: no evidence for active dz on lab testing He suggested pt may have had mild dz premenopausal when intially dz in 1980s in [place], but appears to have resolved in postmenopause state -  he advised against DDAVP prior to colo (and pt underwent colo without complication) At pt request, will refer to new heme for new opinion given potential for eventual ortho surgical intervention

## 2013-06-18 ENCOUNTER — Telehealth: Payer: Self-pay | Admitting: Hematology and Oncology

## 2013-06-18 NOTE — Telephone Encounter (Signed)
lvom for pt to return call in re referral.

## 2013-07-02 ENCOUNTER — Encounter: Payer: Self-pay | Admitting: Hematology and Oncology

## 2013-07-02 ENCOUNTER — Ambulatory Visit (HOSPITAL_BASED_OUTPATIENT_CLINIC_OR_DEPARTMENT_OTHER): Payer: Medicare Other | Admitting: Hematology and Oncology

## 2013-07-02 VITALS — BP 138/73 | HR 85 | Temp 97.6°F | Resp 20 | Ht 64.5 in | Wt 180.5 lb

## 2013-07-02 DIAGNOSIS — F4322 Adjustment disorder with anxiety: Secondary | ICD-10-CM

## 2013-07-02 DIAGNOSIS — D680 Von Willebrand's disease: Secondary | ICD-10-CM

## 2013-07-02 NOTE — Progress Notes (Signed)
[facility] OFFICE PROGRESS NOTE  [name], MD DIAGNOSIS:  Von Willebrand disease  SUMMARY OF HEMATOLOGIC HISTORY: This is a 67 year old lady with background history of mild von Willebrand's disease. She was diagnosed in the 78s after presentation with severe gum bleeding after dental procedures. The patient also have dysfunctional uterine bleeding and underwent cyst removal followed by abdominal hysterectomy. She received cryoprecipitate without bleeding complication. Before another procedure, she had DDAVP and did well. INTERVAL HISTORY: Teresa Huber 67 y.o. female returns for further followup. In the past year, she denies any bleeding complication. She take aspirin periodically for disease progression. The patient denies any recent signs or symptoms of bleeding such as spontaneous epistaxis, hematuria or hematochezia. The patient complained of mild depressed mood due to difficulties dealing with her husband who has multiple medical problems.  I have reviewed the past medical history, past surgical history, social history and family history with the patient and they are unchanged from previous note.  ALLERGIES:  is allergic to betadine; ether; iodine; red dye; and novocain.  MEDICATIONS:  Current Outpatient Prescriptions  Medication Sig Dispense Refill  . citalopram (CELEXA) 20 MG tablet Take 10 mg by mouth daily. Takes 1/2 tablet      . glucosamine-chondroitin 500-400 MG tablet Take 1 tablet by mouth 3 (three) times daily.      Marland Kitchen aspirin 81 MG chewable tablet Chew 81 mg by mouth daily.      . B Complex-C (B-COMPLEX WITH VITAMIN C) tablet Take 1 tablet by mouth daily.      Marland Kitchen BIOTIN FORTE PO Take 1 tablet by mouth 2 (two) times a week.       . calcium carbonate (TUMS EX) 750 MG chewable tablet Chew 2 tablets by mouth daily.      . cetirizine (ZYRTEC) 10 MG tablet Take 10 mg by mouth daily.      . Cholecalciferol (VITAMIN D) 2000 UNITS tablet Take 6,000 Units by  mouth daily.      . fish oil-omega-3 fatty acids 1000 MG capsule Take 2 g by mouth 3 (three) times a week.      Marland Kitchen HYDROcodone-acetaminophen (NORCO/VICODIN) 5-325 MG per tablet Take 1 tablet by mouth every 8 (eight) hours as needed for severe pain.  40 tablet  0   No current facility-administered medications for this visit.     REVIEW OF SYSTEMS:   Constitutional: Denies fevers, chills or night sweats Eyes: Denies blurriness of vision Ears, nose, mouth, throat, and face: Denies mucositis or sore throat Respiratory: Denies cough, dyspnea or wheezes Cardiovascular: Denies palpitation, chest discomfort or lower extremity swelling Gastrointestinal:  Denies nausea, heartburn or change in bowel habits Skin: Denies abnormal skin rashes Lymphatics: Denies new lymphadenopathy or easy bruising Neurological:Denies numbness, tingling or new weaknesses Behavioral/Psych: Mood is stable, no new changes  All other systems were reviewed with the patient and are negative.  PHYSICAL EXAMINATION: ECOG PERFORMANCE STATUS: 0 - Asymptomatic  Filed Vitals:   07/02/13 1337  BP: 138/73  Pulse: 85  Temp: 97.6 F (36.4 C)  Resp: 20   Filed Weights   07/02/13 1337  Weight: 180 lb 8 oz (81.874 kg)    GENERAL:alert, no distress and comfortable SKIN: skin color, texture, turgor are normal, no rashes or significant lesions NEURO: alert & oriented x 3 with fluent speech, no focal motor/sensory deficits  LABORATORY DATA:  I have reviewed the data as listed No results found for this or any previous visit (from the past  48 hour(s)).  Lab Results  Component Value Date   WBC 7.3 02/09/2013   HGB 13.7 02/09/2013   HCT 39.9 02/09/2013   MCV 88.5 02/09/2013   PLT 170 02/09/2013    ASSESSMENT & PLAN:  #1 von Willebrand's disease The patient is doing well with no bleeding complication. I cautioned her about the use of aspirin. Using aspirin is not a contraindication but the patient needs to be aware about risk  of leading due to her bleeding disorder. #2 adjustment disorder Spent a lot of time counseling the patient about her social situation.  All questions were answered. The patient knows to call the clinic with any problems, questions or concerns. No barriers to learning was detected.  I spent 25 minutes counseling the patient face to face. The total time spent in the appointment was 40 minutes and more than 50% was on counseling.     [facility], Graviela Nodal, MD 07/02/2013 3:13 PM

## 2013-07-03 ENCOUNTER — Telehealth: Payer: Self-pay | Admitting: Hematology and Oncology

## 2013-07-03 NOTE — Telephone Encounter (Signed)
lvm for pt regarding to DEc 2015 appt...mailed pt letter and avs

## 2013-07-08 ENCOUNTER — Ambulatory Visit
Admission: RE | Admit: 2013-07-08 | Discharge: 2013-07-08 | Disposition: A | Discharge status: Home / Self Care | Payer: Medicare Other | Source: Ambulatory Visit

## 2013-07-08 DIAGNOSIS — Z1231 Encounter for screening mammogram for malignant neoplasm of breast: Secondary | ICD-10-CM

## 2013-07-21 ENCOUNTER — Encounter: Payer: Self-pay | Admitting: Internal Medicine

## 2013-07-21 ENCOUNTER — Ambulatory Visit (INDEPENDENT_AMBULATORY_CARE_PROVIDER_SITE_OTHER): Payer: Medicare Other | Admitting: Internal Medicine

## 2013-07-21 VITALS — BP 122/64 | HR 80 | Temp 98.9°F | Ht 64.5 in | Wt 181.0 lb

## 2013-07-21 DIAGNOSIS — E042 Nontoxic multinodular goiter: Secondary | ICD-10-CM

## 2013-07-21 LAB — TSH: TSH: 0.7 u[IU]/mL (ref 0.35–5.50)

## 2013-07-21 LAB — T4, FREE: Free T4: 0.83 ng/dL (ref 0.60–1.60)

## 2013-07-21 NOTE — Patient Instructions (Signed)
Please stop at the lab. I will send you the results through MyChart. If labs are abnormal, we will need a new set of thyroid labs in ~6 weeks. I will let you know. Please return in a year.  HAPPY NEW YEAR!

## 2013-07-21 NOTE — Progress Notes (Addendum)
Patient ID: Teresa Huber, female   DOB: 1946-04-10, 67 y.o.   MRN: [id number]   HPI  Teresa Huber is a 67 y.o.-year-old female, referred by her PCP, Dr. Felicity Coyer, for evaluation for MNG. She was seeing Dr Altheimer before.  Thyroid U/S (05/29/2001):  BILATERAL TINY HYPODENSE NODULES WITHOUT DOMINANT MASS.  Pt denies feeling nodules in neck, hoarseness, dysphagia/odynophagia, SOB with lying down.  Pt's thyroid tests were normal as per last check earlier this year.  Pt c/o: - + heat intolerance - during the summer/no cold intolerance - no tremors - no palpitations - + anxiety/no depression - no hyperdefecation/no constipation - no weight loss - no weight gain - no dry skin - no hair falling - + fatigue  Pt does not have a FH of thyroid ds. No FH of thyroid cancer. No h/o radiation tx to head or neck.  No seaweed (allergic) or kelp, no iodine supplements, no recent contrast studies. No steroid use. No herbal supplements.   I reviewed her chart and she also has a history of vWD, anxiety, OA, TAH + BSO 1992 (fibroids), OA hip.   ROS: Constitutional: see HPI Eyes: no blurry vision, no xerophthalmia ENT: + sore throat (has a cold), no nodules palpated in throat, no dysphagia/odynophagia, no hoarseness Cardiovascular: no CP/+ SOB - walking up stairs/no palpitations/leg swelling Respiratory: no cough/+SOB Gastrointestinal: no N/V/D/C Musculoskeletal: no muscle/+ joint aches Skin: no rashes Neurological: no tremors/numbness/tingling/dizziness, + HA 1-2 per mo Psychiatric: no depression/anxiety  Past Medical History  Diagnosis Date  . IBS (irritable bowel syndrome)   . Diverticulosis   . Mitral valve prolapse   . VWD (von Willebrand's disease)     diagnosed in [place] (Dr. Clearance Coots); [facility] (Dr.   . Depression   . Anxiety   . Arthritis   . Headache(784.0)   . Allergic rhinitis   . Internal hemorrhoids   . Thyroid nodule    Past Surgical History  Procedure  Laterality Date  . Benign breast cyst removal  1970  . Right ovarian cyst removal  1986    was on cryo (day before, of, and day after)   . Abdominal hysterectomy  1992    was on cryo (day before, of, and day after)   . Transrectal drainage of pelvic abscess  1968   History   Social History  . Marital Status: Married    Spouse Name: N/A    Number of Children: 2  . Years of Education: N/A   Occupational History  .      retired Psychologist, sport and exercise.    Social History Main Topics  . Smoking status: Never Smoker   . Smokeless tobacco: Never Used  . Alcohol Use: No  . Drug Use: No  . Sexual Activity:    Other Topics Concern  . Not on file   Social History Narrative  . No narrative on file   Current Outpatient Prescriptions on File Prior to Visit  Medication Sig Dispense Refill  . aspirin 81 MG chewable tablet Chew 81 mg by mouth daily.      . B Complex-C (B-COMPLEX WITH VITAMIN C) tablet Take 1 tablet by mouth daily.      Marland Kitchen BIOTIN FORTE PO Take 1 tablet by mouth 2 (two) times a week.       . calcium carbonate (TUMS EX) 750 MG chewable tablet Chew 2 tablets by mouth daily.      . cetirizine (ZYRTEC) 10 MG tablet Take 10 mg by mouth  daily.      . Cholecalciferol (VITAMIN D) 2000 UNITS tablet Take 6,000 Units by mouth daily.      . citalopram (CELEXA) 20 MG tablet Take 10 mg by mouth daily. Takes 1/2 tablet      . fish oil-omega-3 fatty acids 1000 MG capsule Take 2 g by mouth 3 (three) times a week.      Marland Kitchen glucosamine-chondroitin 500-400 MG tablet Take 1 tablet by mouth 3 (three) times daily.      Marland Kitchen HYDROcodone-acetaminophen (NORCO/VICODIN) 5-325 MG per tablet Take 1 tablet by mouth every 8 (eight) hours as needed for severe pain.  40 tablet  0   No current facility-administered medications on file prior to visit.   Allergies  Allergen Reactions  . Betadine [Povidone Iodine] Itching  . Ether Other (See Comments)    blisters  . Iodine Itching  . Red Dye Itching    All Red dyes  cause severe itching  . Novocain [Procaine] Rash   Family History  Problem Relation Age of Onset  . Heart disease Mother   . Cancer Father     mesothelioma  . Evelene Croon Parkinson White syndrome Brother   . Cancer Maternal Aunt 94    breast or ovarian  . Cancer Maternal Aunt     ovarian   PE: BP 122/64  Pulse 80  Temp(Src) 98.9 F (37.2 C) (Oral)  Ht 5' 4.5" (1.638 m)  Wt 181 lb (82.101 kg)  BMI 30.60 kg/m2  SpO2 96% Wt Readings from Last 3 Encounters:  07/21/13 181 lb (82.101 kg)  07/02/13 180 lb 8 oz (81.874 kg)  06/17/13 179 lb 3.2 oz (81.285 kg)   Constitutional: overweight, in NAD Eyes: PERRLA, EOMI, no exophthalmos ENT: moist mucous membranes, no thyromegaly - but thyroid nodule felt in left lobe, no cervical lymphadenopathy Cardiovascular: RRR, No MRG Respiratory: CTA B Gastrointestinal: abdomen soft, NT, ND, BS+ Musculoskeletal: no deformities, strength intact in all 4;  Skin: moist, warm, no rashes Neurological: no tremor with outstretched hands, DTR normal in all 4  ASSESSMENT: 1. MNG Thyroid U/S (05/29/2001):   RIGHT LOBE ~ 18 X 24 X 51 MM. THERE IS A 3 MM. HYPOECHOIC NODULE LATERALLY IN THE LOWER POLE RIGHT. THERE IS A 4 MM. HYPOECHOIC NODULE MORE MEDIALLY IN THE LOWER POLE RIGHT. THESE ARE STABLE IN SIZE AND APPEARANCE COMPARED TO PREVIOUS SCAN OF 11/09/00.   ISTHMUS IS NORMAL IN APPEARANCE WITH A FOCAL SMOOTH CONTOUR BULGE OF THE THYROID AT THE JUNCTION OF THE ISTHMUS WITH THE RIGHT LOBE ALTHOUGH THERE IS NO EVIDENCE OF A DISCRETE LESION HERE.  LEFT LOBE ~ 20 X 58 MM. THERE ARE THREE HYPOECHOIC NODULES. ONE IN THE UPPER POLE MEASURES 6 MM., 2.4 MM. LESION LATERALLY IN THE LOWER POLE AND A 4.9 MM. LESION AT THE INFERIOR TIP OF THE LOWER POLE LEFT. THESE ARE ALL STABLE SINCE THE PREVIOUS SCAN. NO NEW LESIONS ARE EVIDENT. IMPRESSION  NO SIGNIFICANT CHANGE SINCE 11/09/00. BILATERAL TINY HYPODENSE NODULES WITHOUT DOMINANT MASS.  PLAN: 1. MNG  - I reviewed the  report of her thyroid ultrasound along with the patient. I pointed out that the nodules are very small, not meeting criteria for surgery. Pt does not have a thyroid cancer family history or a personal history of RxTx to head/neck. All these would favor benignity. - however, the last U/S was from 2002 >> we will repeat the U/S this year - we will also check TFTs since she c/o anxiety and heat intolerance -  she does not have neck compression sxs - I'll see her back in a year, assuming her U/S is normal. If abnormal, may need FNA. If FNA abnormal, we will meet sooner.  - I advised pt to join my chart and I will send her the results through there   Office Visit on 07/21/2013  Component Date Value Range Status  . TSH 07/21/2013 0.70  0.35 - 5.50 uIU/mL Final  . Free T4 07/21/2013 0.83  0.60 - 1.60 ng/dL Final  . T3, Free 16/04/9603 2.9  2.3 - 4.2 pg/mL Final   TFTs normal.  U/S thyroid pending - pt cancelled Radiology appt >>  will addend results when available.  CLINICAL DATA History of multinodular goiter  EXAM THYROID ULTRASOUND  TECHNIQUE Ultrasound examination of the thyroid gland and adjacent soft tissues was performed.  COMPARISON None.  FINDINGS Right thyroid lobe  Measurements: 5.0 x 2.0 x 2.4 cm. Small nodules are present throughout the right lobe. The largest solid nodule is in the medial right upper lobe near the isthmus measuring 1.6 x 0.4 x 1.1 cm. Small nodules are present of no more than 5 mm in diameter.  Left thyroid lobe  Measurements: 5.2 x 2.2 x 2.5 cm. Small nodules on the left measuring no more than 7 mm in diameter.  Isthmus  Thickness: 3 mm in thickness. No nodules visualized.  Lymphadenopathy  No adenopathy is seen.  IMPRESSION Multiple small bilateral thyroid nodules, the largest in the medial right upper pole of 1.6 cm. This nodule does meet criteria for biopsy. Ultrasound-guided fine needle aspiration should be considered, as per the  consensus statement: Management of Thyroid Nodules Detected at [place]: Society of Radiologists in Ultrasound Consensus Conference Statement. Radiology 2005; [id number].  SIGNATURE  Electronically Signed By: Dwyane Dee M.D. On: 10/01/2013 14:56  Will advise FNA of the isthmic nodule.  D/w pt >> agrees to Bx the 1.6 cm nodule.  10/23/2013 CLINICAL DATA: Intended biopsy of the dominant thyroid nodule  EXAM: ULTRASOUND OF HEAD/NECK SOFT TISSUES  TECHNIQUE: Ultrasound examination of the head and neck soft tissues was performed in the area of clinical concern.  COMPARISON: US SOFT TISSUE HEAD/NECK dated 10/01/2013  FINDINGS: The patient was were foreign for a 1.6 cm nodule biopsy in the right its mass. No corresponding defined nodule could be identified corresponding to the described abnormality. Subcentimeter nodules are noted. Biopsy was not performed.  IMPRESSION: The nodule in question was not identified and most likely represented a pseudo nodule. Other nodules are subcentimeter in size. Findings do not meet current SRU consensus criteria for biopsy. Follow-up by clinical exam is recommended. If patient has known risk factors for thyroid carcinoma, consider follow-up ultrasound in 12 months. If patient is clinically hyperthyroid, consider nuclear medicine thyroid uptake and scan.Reference: Management of Thyroid Nodules Detected at [place]: Society of Radiologists in Ultrasound Consensus Conference Statement. Radiology 2005; [id number].   Electronically Signed By: Maryclare Bean M.D. On: 10/23/2013 15:02

## 2013-07-23 ENCOUNTER — Telehealth: Payer: Self-pay | Admitting: *Deleted

## 2013-07-23 NOTE — Telephone Encounter (Signed)
Called pt and advised her that her TFTs all normal. She wants await results of the thyroid U/S before making any decisions. Pt understood.

## 2013-07-25 ENCOUNTER — Ambulatory Visit: Admission: RE | Admit: 2013-07-25 | Payer: Medicare Other | Source: Ambulatory Visit

## 2013-07-28 ENCOUNTER — Telehealth: Payer: Self-pay | Admitting: *Deleted

## 2013-07-28 NOTE — Telephone Encounter (Signed)
Pt called stating that she has pneumonia. She went to have her U/S done on Friday and was unable to follow through with it, she was unable to lay back b/c she felt like she was choking the whole time and could not follow through. She stated she went to the dr. She states she has to re-schedule the U/S until she is better. Approx. 2-3 weeks. Be advised.

## 2013-08-14 ENCOUNTER — Encounter: Payer: Self-pay | Admitting: *Deleted

## 2013-08-14 NOTE — Telephone Encounter (Signed)
Letter sent to pt advising her that she can re-schedule her U/S of her thyroid if she is feeling better. Letter has the number she can call to re-schedule.

## 2013-09-17 ENCOUNTER — Ambulatory Visit: Payer: Medicare Other | Admitting: Internal Medicine

## 2013-09-17 ENCOUNTER — Telehealth: Payer: Self-pay | Admitting: *Deleted

## 2013-09-17 ENCOUNTER — Other Ambulatory Visit: Payer: Self-pay | Admitting: Internal Medicine

## 2013-09-17 DIAGNOSIS — E041 Nontoxic single thyroid nodule: Secondary | ICD-10-CM

## 2013-09-17 DIAGNOSIS — Z0289 Encounter for other administrative examinations: Secondary | ICD-10-CM

## 2013-09-17 NOTE — Telephone Encounter (Signed)
Percell Beltriana from [facility] called stating the pt called to re-schedule her thyroid U/S. She cancelled her previous one due to being sick. For them to re-schedule this, new orders need to be put in. Can you please put in new orders? Thank you.

## 2013-09-17 NOTE — Telephone Encounter (Signed)
Done

## 2013-10-01 ENCOUNTER — Ambulatory Visit
Admission: RE | Admit: 2013-10-01 | Discharge: 2013-10-01 | Disposition: A | Discharge status: Home / Self Care | Payer: Medicare Other | Source: Ambulatory Visit | Attending: Internal Medicine | Admitting: Internal Medicine

## 2013-10-02 NOTE — Addendum Note (Signed)
Addended by: Carlus PavlovGHERGHE, Jacson Rapaport on: 10/02/2013 05:16 PM   Modules accepted: Orders

## 2013-10-15 ENCOUNTER — Encounter: Payer: Self-pay | Admitting: Internal Medicine

## 2013-10-15 ENCOUNTER — Ambulatory Visit (INDEPENDENT_AMBULATORY_CARE_PROVIDER_SITE_OTHER): Payer: Medicare Other | Admitting: Internal Medicine

## 2013-10-15 VITALS — BP 122/78 | HR 86 | Temp 98.1°F | Wt 183.0 lb

## 2013-10-15 DIAGNOSIS — F411 Generalized anxiety disorder: Secondary | ICD-10-CM

## 2013-10-15 DIAGNOSIS — E041 Nontoxic single thyroid nodule: Secondary | ICD-10-CM

## 2013-10-15 DIAGNOSIS — M1710 Unilateral primary osteoarthritis, unspecified knee: Secondary | ICD-10-CM

## 2013-10-15 DIAGNOSIS — IMO0002 Reserved for concepts with insufficient information to code with codable children: Secondary | ICD-10-CM

## 2013-10-15 DIAGNOSIS — M179 Osteoarthritis of knee, unspecified: Secondary | ICD-10-CM

## 2013-10-15 NOTE — Patient Instructions (Signed)
It was good to see you today.  We have reviewed your prior records including labs and tests today  Medications reviewed and updated, no changes recommended at this time.  Please schedule followup in 4-6 months for annual exam and labs, call sooner if problems.

## 2013-10-15 NOTE — Assessment & Plan Note (Signed)
Exacerbated by PMP state and hot flashes Overlap with depression - exacerbated by spouse's declining health On celexa - prior Effexor trial caused in BP and remote Prozac prior to SNRI for same As symptoms "stable", continue work on management of severe OA pain spells i advised against resuming HRT at this time given potential risks>benefit If symptoms continue to escalate, consider alternate SNRI/SSRI but pt declines desire for change at this time Support offered at length

## 2013-10-15 NOTE — Assessment & Plan Note (Signed)
Follows with ortho for same - affectes knees and R hip pain Daily symptoms, inconsistently relieved with OTC meds Reports significant relief with hydrocodone as rx'd 01/2013 for kidney stone symptoms 05/2013 given limited # norco to use as needed for severe symptoms unrelieved by OTC meds -rare use verified and reviewed today follow up ortho as ongoing

## 2013-10-15 NOTE — Progress Notes (Signed)
Subjective:    Patient ID: Teresa Huber, female    DOB: 07/27/1945, 68 y.o.   MRN: [id number]  HPI  Patient is here for follow up  Reviewed chronic medical issues and interval medical events  Past Medical History  Diagnosis Date  . IBS (irritable bowel syndrome)   . Diverticulosis   . Mitral valve prolapse   . VWD (von Willebrand's disease)     diagnosed in [place] (Dr. Clearance Coots); [facility] (Dr.   . Depression   . Anxiety   . Arthritis   . Headache(784.0)   . Allergic rhinitis   . Internal hemorrhoids   . Thyroid nodule     Review of Systems  Constitutional: Positive for fatigue. Negative for unexpected weight change.  Respiratory: Negative for cough and shortness of breath.   Cardiovascular: Negative for chest pain and leg swelling.  Musculoskeletal: Positive for arthralgias. Negative for gait problem and joint swelling.  Psychiatric/Behavioral: Negative for behavioral problems, confusion and dysphoric mood. Nervous/anxious: chronic -panic attacks.        Objective:   Physical Exam  BP 122/78  Pulse 86  Temp(Src) 98.1 F (36.7 C) (Oral)  Wt 183 lb (83.008 kg)  SpO2 95% Wt Readings from Last 3 Encounters:  10/15/13 183 lb (83.008 kg)  07/21/13 181 lb (82.101 kg)  07/02/13 180 lb 8 oz (81.874 kg)   Constitutional: She appears well-developed and well-nourished. No distress.  Neck: Normal range of motion. Neck supple. No JVD present. No thyromegaly present.  Cardiovascular: Normal rate, regular rhythm and normal heart sounds.  No murmur heard. No BLE edema. Pulmonary/Chest: Effort normal and breath sounds normal. No respiratory distress. She has no wheezes.  Psychiatric: She has a normal mood and affect. Her behavior is normal. Judgment and thought content normal.   Lab Results  Component Value Date   WBC 7.3 02/09/2013   HGB 13.7 02/09/2013   HCT 39.9 02/09/2013   PLT 170 02/09/2013   GLUCOSE 158* 02/09/2013   ALT 16 02/09/2013   AST 18 02/09/2013   NA  140 02/09/2013   K 3.9 02/09/2013   CL 105 02/09/2013   CREATININE 0.95 02/09/2013   BUN 20 02/09/2013   CO2 26 02/09/2013   TSH 0.70 07/21/2013    US Soft Tissue Head/neck  10/01/2013   CLINICAL DATA History of multinodular goiter  EXAM THYROID ULTRASOUND  TECHNIQUE Ultrasound examination of the thyroid gland and adjacent soft tissues was performed.  COMPARISON None.  FINDINGS Right thyroid lobe  Measurements: 5.0 x 2.0 x 2.4 cm. Small nodules are present throughout the right lobe. The largest solid nodule is in the medial right upper lobe near the isthmus measuring 1.6 x 0.4 x 1.1 cm. Small nodules are present of no more than 5 mm in diameter.  Left thyroid lobe  Measurements: 5.2 x 2.2 x 2.5 cm. Small nodules on the left measuring no more than 7 mm in diameter.  Isthmus  Thickness: 3 mm in thickness.  No nodules visualized.  Lymphadenopathy  No adenopathy is seen.  IMPRESSION Multiple small bilateral thyroid nodules, the largest in the medial right upper pole of 1.6 cm. This nodule does meet criteria for biopsy. Ultrasound-guided fine needle aspiration should be considered, as per the consensus statement: Management of Thyroid Nodules Detected at [place]: Society of Radiologists in Ultrasound Consensus Conference Statement. Radiology 2005; [id number].  SIGNATURE  Electronically Signed   By: Dwyane Dee M.D.   On: 10/01/2013 14:56  Assessment & Plan:   Problem List Items Addressed This Visit   Anxiety state, unspecified     Exacerbated by PMP state and hot flashes Overlap with depression - exacerbated by spouse's declining health On celexa - prior Effexor trial caused in BP and remote Prozac prior to SNRI for same As symptoms "stable", continue work on management of severe OA pain spells i advised against resuming HRT at this time given potential risks>benefit If symptoms continue to escalate, consider alternate SNRI/SSRI but pt declines desire for change at this time Support offered at length     Osteoarthritis, knee     Follows with ortho for same - affectes knees and R hip pain Daily symptoms, inconsistently relieved with OTC meds Reports significant relief with hydrocodone as rx'd 01/2013 for kidney stone symptoms 05/2013 given limited # norco to use as needed for severe symptoms unrelieved by OTC meds -rare use verified and reviewed today follow up ortho as ongoing    Thyroid nodule - Primary     s/p eval for endo12/2014 Planning thyroid bx 10/2013 - Interval history reviewed continue with endocrinology for management of same as ongoing      Time spent with pt today 25 minutes, greater than 50% time spent counseling patient on anxiety, OA and medication review. Also review of prior records

## 2013-10-15 NOTE — Progress Notes (Signed)
Pre visit review using our clinic review tool, if applicable. No additional management support is needed unless otherwise documented below in the visit note. 

## 2013-10-15 NOTE — Assessment & Plan Note (Signed)
s/p eval for endo12/2014 Planning thyroid bx 10/2013 - Interval history reviewed continue with endocrinology for management of same as ongoing

## 2013-10-17 ENCOUNTER — Encounter: Payer: Self-pay | Admitting: Internal Medicine

## 2013-10-22 ENCOUNTER — Ambulatory Visit
Admission: RE | Admit: 2013-10-22 | Discharge: 2013-10-22 | Disposition: A | Discharge status: Home / Self Care | Payer: Medicare Other | Source: Ambulatory Visit | Attending: Internal Medicine | Admitting: Internal Medicine

## 2013-10-22 ENCOUNTER — Other Ambulatory Visit: Payer: Self-pay | Admitting: Internal Medicine

## 2013-10-22 DIAGNOSIS — E042 Nontoxic multinodular goiter: Secondary | ICD-10-CM

## 2013-11-20 ENCOUNTER — Telehealth: Payer: Self-pay | Admitting: *Deleted

## 2013-11-20 DIAGNOSIS — M79672 Pain in left foot: Secondary | ICD-10-CM

## 2013-11-20 NOTE — Telephone Encounter (Signed)
Pt called requesting Podiatry referral.  Reference painful heel.  Please advise in Dr Diamantina MonksLeschber's absence

## 2013-11-20 NOTE — Telephone Encounter (Signed)
If ok with pt, she can see Dr Katrinka BlazingSmith for this, likely faster - order placed

## 2013-11-20 NOTE — Telephone Encounter (Signed)
Spoke with pt, scheduled appoint with Dr Katrinka BlazingSmith

## 2013-11-24 ENCOUNTER — Encounter: Payer: Self-pay | Admitting: Family Medicine

## 2013-11-24 ENCOUNTER — Other Ambulatory Visit (INDEPENDENT_AMBULATORY_CARE_PROVIDER_SITE_OTHER): Payer: Medicare Other

## 2013-11-24 ENCOUNTER — Ambulatory Visit (INDEPENDENT_AMBULATORY_CARE_PROVIDER_SITE_OTHER): Payer: Medicare Other | Admitting: Family Medicine

## 2013-11-24 ENCOUNTER — Ambulatory Visit (INDEPENDENT_AMBULATORY_CARE_PROVIDER_SITE_OTHER)
Admission: RE | Admit: 2013-11-24 | Discharge: 2013-11-24 | Disposition: A | Discharge status: Home / Self Care | Payer: Medicare Other | Source: Ambulatory Visit | Attending: Family Medicine | Admitting: Family Medicine

## 2013-11-24 ENCOUNTER — Other Ambulatory Visit: Payer: Self-pay | Admitting: Family Medicine

## 2013-11-24 VITALS — BP 146/84 | HR 84

## 2013-11-24 DIAGNOSIS — M25551 Pain in right hip: Secondary | ICD-10-CM

## 2013-11-24 DIAGNOSIS — M25559 Pain in unspecified hip: Secondary | ICD-10-CM

## 2013-11-24 DIAGNOSIS — M79671 Pain in right foot: Secondary | ICD-10-CM

## 2013-11-24 DIAGNOSIS — M7750 Other enthesopathy of unspecified foot: Secondary | ICD-10-CM

## 2013-11-24 DIAGNOSIS — M79609 Pain in unspecified limb: Secondary | ICD-10-CM

## 2013-11-24 DIAGNOSIS — M1611 Unilateral primary osteoarthritis, right hip: Secondary | ICD-10-CM

## 2013-11-24 DIAGNOSIS — M1610 Unilateral primary osteoarthritis, unspecified hip: Secondary | ICD-10-CM

## 2013-11-24 MED ORDER — MELOXICAM 15 MG PO TABS: 15.0000 mg | ORAL_TABLET | Freq: Every day | ORAL | Status: AC

## 2013-11-24 NOTE — Progress Notes (Signed)
Teresa Huber D.O. [facility] [address] [place], [place] [address] Phone: [phone] Subjective:    I'm seeing this patient by the request  of:  Teresa PaciValerie Leschber, MD   CC: hip pain, heel pain rigjht  BJY:[id number]:Subjective Teresa RiggersMarilyn J Huber is a 68 y.o. female coming in with complaint of right hip pain and right heel pain.  Regarding patient's hip pain patient states that she has had this pain intermittently for quite sometime. Patient is seen another provider multiple month ago and was told that she had arthritis. Patient states that she has pain on the lateral aspect but is now having actually pain with groin pain. Patient states that this pain can radiate down the anterior aspect of her thigh but denies any significant back pain. Patient states that the pain causes her to change her activity when she used to be very active. Pain is 7/10 in severity. Patient has tried ibuprofen with minimal improvement.  Patient has also been having more of a right heel pain. Patient states that it hurts more with any type of ambulation. Patient states that she can have the sharp pain that does not radiate and seems to be localized to the posterior aspect. Patient denies any swelling but has noticed a nodule on the back of her heel which is made wearing certain shoes difficult. Patient has not tried any modalities and states that this pain is 7/10 in severity.     Past medical history, social, surgical and family history all reviewed in electronic medical record.   Review of Systems: No headache, visual changes, nausea, vomiting, diarrhea, constipation, dizziness, abdominal pain, skin rash, fevers, chills, night sweats, weight loss, swollen lymph nodes, body aches, joint swelling, muscle aches, chest pain, shortness of breath, mood changes.   Objective Blood pressure 146/84, pulse 84, SpO2 97.00%.  General: No apparent distress alert and oriented x3 mood and affect normal, dressed appropriately.    HEENT: Pupils equal, extraocular movements intact  Respiratory: Patient's speak in full sentences and does not appear short of breath  Cardiovascular: No lower extremity edema, non tender, no erythema  Skin: Warm dry intact with no signs of infection or rash on extremities or on axial skeleton.  Abdomen: Soft nontender  Neuro: Cranial nerves II through XII are intact, neurovascularly intact in all extremities with 2+ DTRs and 2+ pulses.  Lymph: No lymphadenopathy of posterior or anterior cervical chain or axillae bilaterally.  Gait normal with good balance and coordination.  MSK:  Non tender with full range of motion and good stability and symmetric strength and tone of shoulders, elbows, wrist,  knees bilaterally.  Hip: Right ROM IR: 25 Deg, ER: 35 Deg, Flexion: 100 Deg, Extension: 100 Deg, Abduction: 45 Deg, Adduction: 45 Deg Strength IR: 4/5, ER: 5/5, Flexion: 5/5, Extension: 5/5, Abduction: 3/5, Adduction: 5/5 Pelvic alignment unremarkable to inspection and palpation. Standing hip rotation and gait with  trendelenburg sign / unsteadiness. Greater trochanter without tenderness to palpation. No tenderness over piriformis and greater trochanter. No pain with FABER but positive FADIR. No SI joint tenderness and normal minimal SI movement. The contralateral hip unremarkable  Ankle:right  Haglund nodule present Range of motion is full in all directions. Strength is 5/5 in all directions. Stable lateral and medial ligaments; squeeze test and kleiger test unremarkable; Talar dome nontender; No pain at base of 5th MT; No tenderness over cuboid; No tenderness over N spot or navicular prominence No tenderness on posterior aspects of lateral and medial malleolus No  sign of peroneal tendon subluxations or tenderness to palpation Negative tarsal tunnel tinel's Able to walk 4 steps. Patient does have tenderness of the retrocalcaneal area as well as some at the insertion of the Achilles.  Negative Thompson. Contralateral ankle unremarkable Mild leg length discrepancy on the right.  MSK US performed of: Right ankle This study was ordered, performed, and interpreted by Terrilee FilesZach Huber D.O.  Foot/Ankle:   All structures visualized.   Talar dome unremarkable  Ankle mortise without effusion. Peroneus longus and brevis tendons unremarkable on long and transverse views without sheath effusions. Posterior tibialis, flexor hallucis longus, and flexor digitorum longus tendons unremarkable on long and transverse views without sheath effusions. Achilles tendon visualized along length of tendon and unremarkable does have some mild increasing Doppler flow at its insertion and some mild calcific changes. Patient does have a very large retrocalcaneal calcific bursitis noted. Anterior Talofibular Ligament and Calcaneofibular Ligaments unremarkable and intact. Deltoid Ligament unremarkable and intact. Plantar fascia intact and without effusion, normal thickness. No increased doppler signal, cap sign, or thickening of tibial cortex. Power doppler signal normal.  IMPRESSION:  Retrocalcaneal bursitis with mild Achilles tendinosis..      Impression and Recommendations:     This case required medical decision making of moderate complexity.

## 2013-11-24 NOTE — Patient Instructions (Signed)
Nice to meet you Exercises 3 times a week Xrays downstairs today.  Ice bath for ankle 20 minutes at least 1 time a day.  Heel lift in right shoe.  meloxicam daily for 10 days then as needed Take tylenol 650 mg three times a day is the best evidence based medicine we have for arthritis.  Glucosamine sulfate 750mg  twice a day is a supplement that has been shown to help moderate to severe arthritis. Vitamin D 2000 IU daily Fish oil 2 grams daily.  Tumeric 500mg  twice daily.  Capsaicin topically up to four times a day may also help with pain. Cortisone injections are an option if these interventions do not seem to make a difference or need more relief.  Come back and see me in 3 weeks.

## 2013-11-24 NOTE — Assessment & Plan Note (Signed)
I do believe the patient is going to have a right-sided hip arthritis. And icing she'll have moderate to severe disease. Patient was given home exercises, over-the-counter medications, as well as an icing protocol. Patient will try these interventions and come back again in 3 weeks. If she continues to have trouble we'll consider doing a corticosteroid injection intra-articular. X-rays were ordered by me today as well.

## 2013-11-24 NOTE — Assessment & Plan Note (Signed)
Patient does have what appears to be a retrocalcaneal bursitis on ultrasound today. Patient was given a heel lift which will help with the leg length discrepancy. In addition this will do and icing protocol, anti-inflammatories, and we discussed proper shoe choices. Patient will come back again in 3 weeks and continued her trouble we will do a corticosteroid injection under ultrasound guidance.

## 2013-11-25 ENCOUNTER — Ambulatory Visit: Payer: Medicare Other | Admitting: Family Medicine

## 2013-12-17 ENCOUNTER — Encounter: Payer: Self-pay | Admitting: Family Medicine

## 2013-12-17 ENCOUNTER — Ambulatory Visit (INDEPENDENT_AMBULATORY_CARE_PROVIDER_SITE_OTHER): Payer: Medicare Other | Admitting: Family Medicine

## 2013-12-17 VITALS — BP 130/80 | HR 70 | Ht 64.5 in | Wt 183.0 lb

## 2013-12-17 DIAGNOSIS — M1611 Unilateral primary osteoarthritis, right hip: Secondary | ICD-10-CM

## 2013-12-17 DIAGNOSIS — M7750 Other enthesopathy of unspecified foot: Secondary | ICD-10-CM

## 2013-12-17 DIAGNOSIS — M1610 Unilateral primary osteoarthritis, unspecified hip: Secondary | ICD-10-CM

## 2013-12-17 MED ORDER — MELOXICAM 15 MG PO TABS: 15.0000 mg | ORAL_TABLET | Freq: Every day | ORAL | Status: DC

## 2013-12-17 NOTE — Progress Notes (Signed)
  [facility] [address] [place], [place] [address] Phone: [phone] Subjective:    I'm seeing this patient by the request  of:  [name], MD   CC: hip pain, heel pain right follow up  [id number] Teresa Huber is a 68 y.o. female coming in with complaint of right hip pain and right heel pain. Patient was seen previously and was diagnosed with a retrocalcaneal bursitis as well as mild arthritis of the right hip. Patient states overall to she started doing the over-the-counter medications, the home exercises, meloxicam and states that she was 100% better within 3-4 days. Patient has continued to do very well. Patient denies any pain and is able to do all activities of daily living. Patient is very happy with the results at this time. No new symptoms.       Past medical history, social, surgical and family history all reviewed in electronic medical record.   Review of Systems: No headache, visual changes, nausea, vomiting, diarrhea, constipation, dizziness, abdominal pain, skin rash, fevers, chills, night sweats, weight loss, swollen lymph nodes, body aches, joint swelling, muscle aches, chest pain, shortness of breath, mood changes.   Objective Blood pressure 130/80, pulse 70, height 5' 4.5" (1.638 m), weight 183 lb (83.008 kg), SpO2 98.00%.  General: No apparent distress alert and oriented x3 mood and affect normal, dressed appropriately.  HEENT: Pupils equal, extraocular movements intact  Respiratory: Patient's speak in full sentences and does not appear short of breath  Cardiovascular: No lower extremity edema, non tender, no erythema  Skin: Warm dry intact with no signs of infection or rash on extremities or on axial skeleton.  Abdomen: Soft nontender  Neuro: Cranial nerves II through XII are intact, neurovascularly intact in all extremities with 2+ DTRs and 2+ pulses.  Lymph: No lymphadenopathy of posterior or anterior cervical chain or  axillae bilaterally.  Gait normal with good balance and coordination.  MSK:  Non tender with full range of motion and good stability and symmetric strength and tone of shoulders, elbows, wrist,  knees bilaterally.  Hip: Right ROM IR: 35 Deg, ER: 35 Deg, Flexion: 100 Deg, Extension: 100 Deg, Abduction: 45 Deg, Adduction: 45 Deg Strength IR: 5/5, ER: 5/5, Flexion: 5/5, Extension: 5/5, Abduction: 3/5, Adduction: 5/5 Pelvic alignment unremarkable to inspection and palpation. Standing hip rotation and gait with  trendelenburg sign / unsteadiness. Greater trochanter without tenderness to palpation. No tenderness over piriformis and greater trochanter. No pain with FABER but positive FADIR but improved No SI joint tenderness and normal minimal SI movement. The contralateral hip unremarkable  Ankle:right  Haglund nodule present Range of motion is full in all directions. Strength is 5/5 in all directions. Stable lateral and medial ligaments; squeeze test and kleiger test unremarkable; Talar dome nontender; No pain at base of 5th MT; No tenderness over cuboid; No tenderness over N spot or navicular prominence No tenderness on posterior aspects of lateral and medial malleolus No sign of peroneal tendon subluxations or tenderness to palpation Negative tarsal tunnel tinel's Able to walk 4 steps. Negative on exam today for any tenderness over the retrocalcaneal bursa or the distal Achilles. Negative Thompson. 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

## 2013-12-17 NOTE — Assessment & Plan Note (Signed)
X-rays show some mild arthritis but overall no significant problem. Patient also responded to anti-inflammatories will continue her regular basis if necessary. Patient to follow up again with me on an as-needed basis.

## 2013-12-17 NOTE — Patient Instructions (Signed)
You are great! Have a safe move.  Stop the meloxicam.  If pain comes back then start it again. Sent in new prescription as well.  Continue the ice Continue the turmeric for 2 weeks then go to 3 times a week and if still doing well then discontinue.  See me when you need me.

## 2013-12-17 NOTE — Assessment & Plan Note (Signed)
Patient is responding very well to conservative therapy. Patient will continue this and will followup on an as-needed basis.

## 2014-02-18 ENCOUNTER — Other Ambulatory Visit: Payer: Self-pay | Admitting: Internal Medicine

## 2014-02-19 ENCOUNTER — Other Ambulatory Visit: Payer: Self-pay | Admitting: Internal Medicine

## 2014-03-23 ENCOUNTER — Other Ambulatory Visit: Payer: Self-pay | Admitting: Family Medicine

## 2014-03-23 NOTE — Telephone Encounter (Signed)
Refill done.  

## 2014-07-03 ENCOUNTER — Ambulatory Visit: Payer: Medicare Other | Admitting: Hematology and Oncology

## 2014-07-05 IMAGING — CR DG HIP COMPLETE 2+V*R*
3 series · 3 of 3 positions shown · non-contrast
Comparison: None.

CLINICAL DATA: hip pain

EXAM:
RIGHT HIP - COMPLETE 2+ VIEW

[view not recorded (1 of 3)]
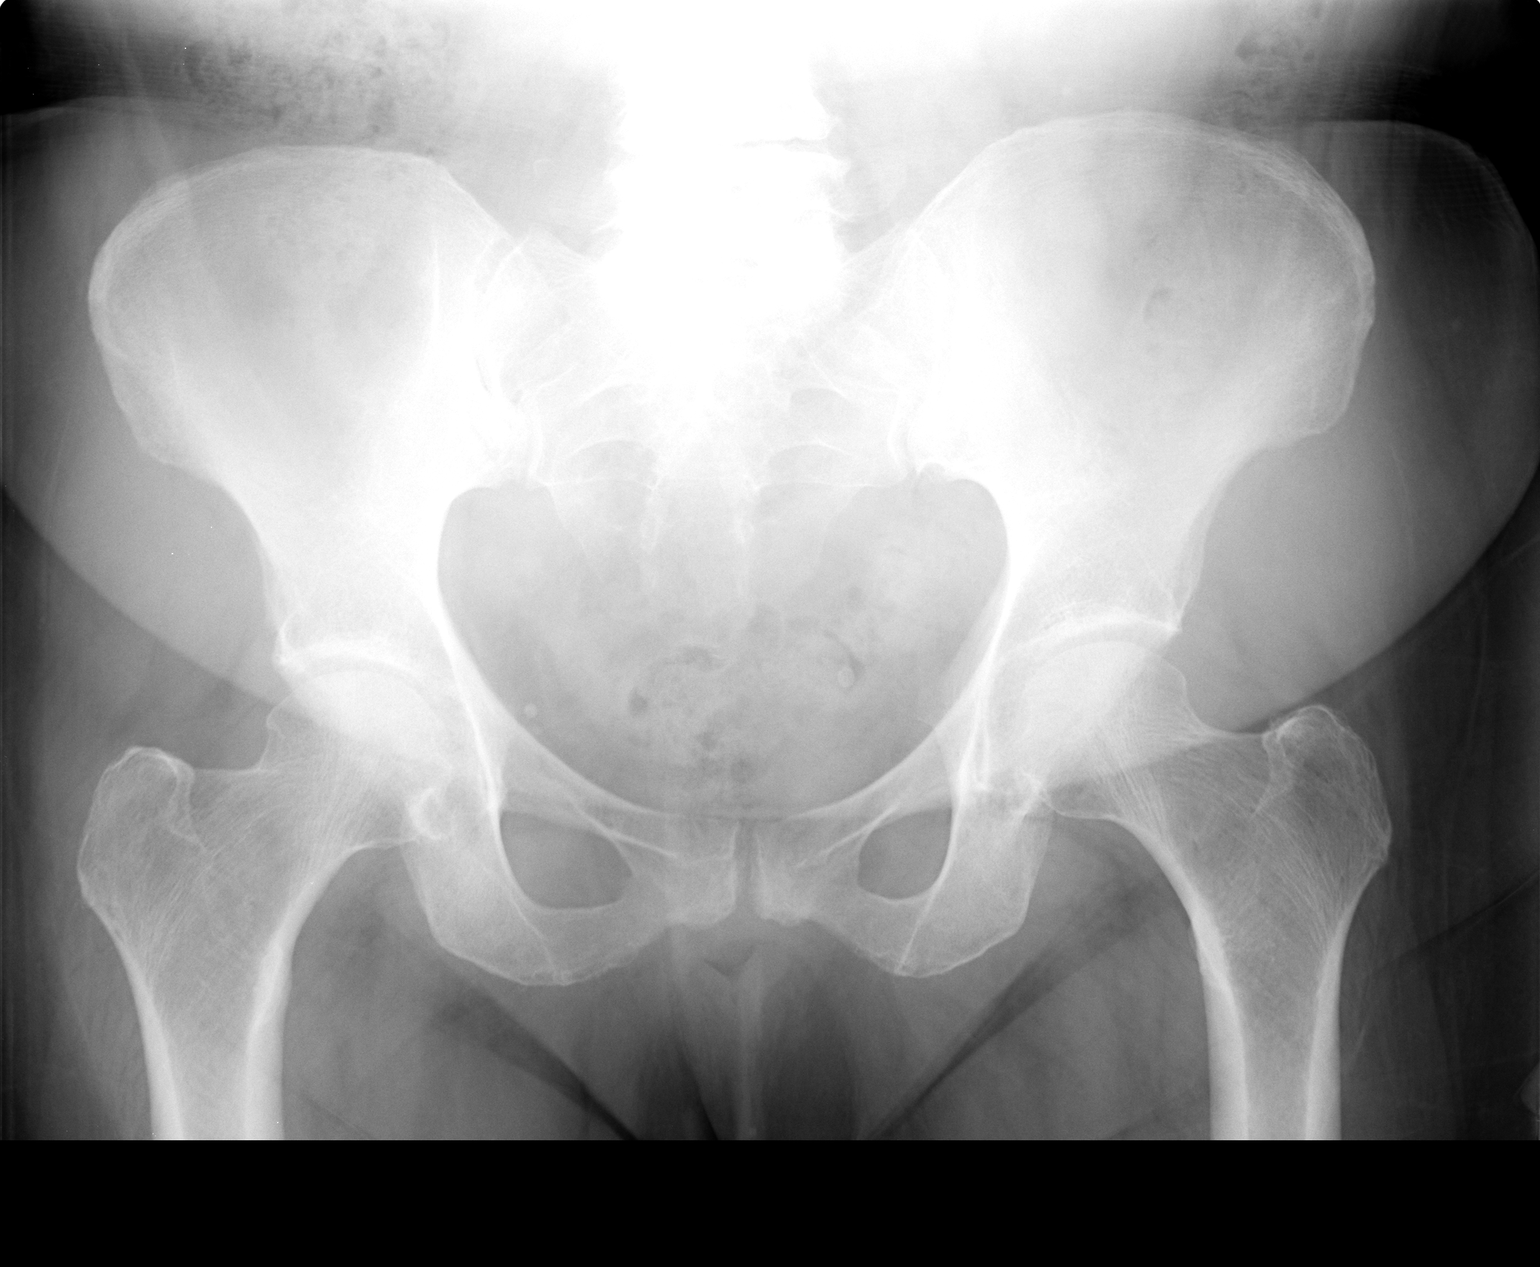

[view not recorded (2 of 3)]
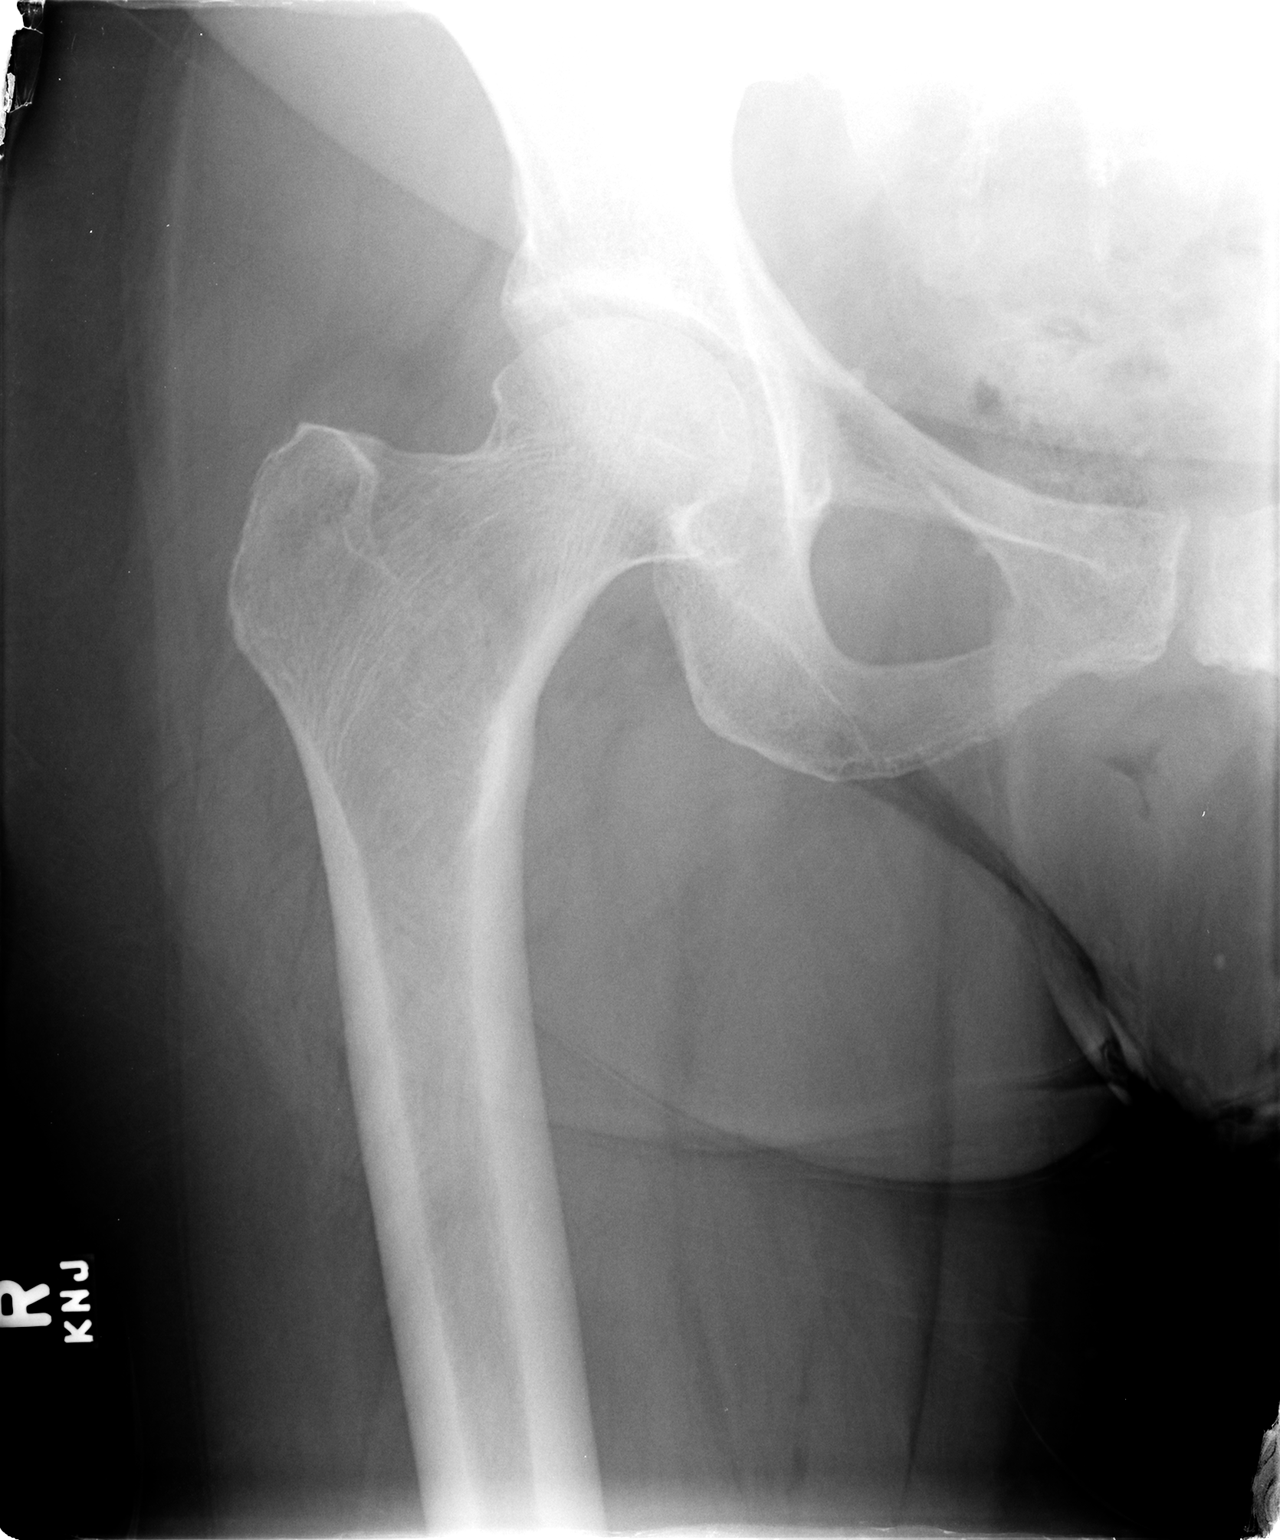

[view not recorded (3 of 3)]
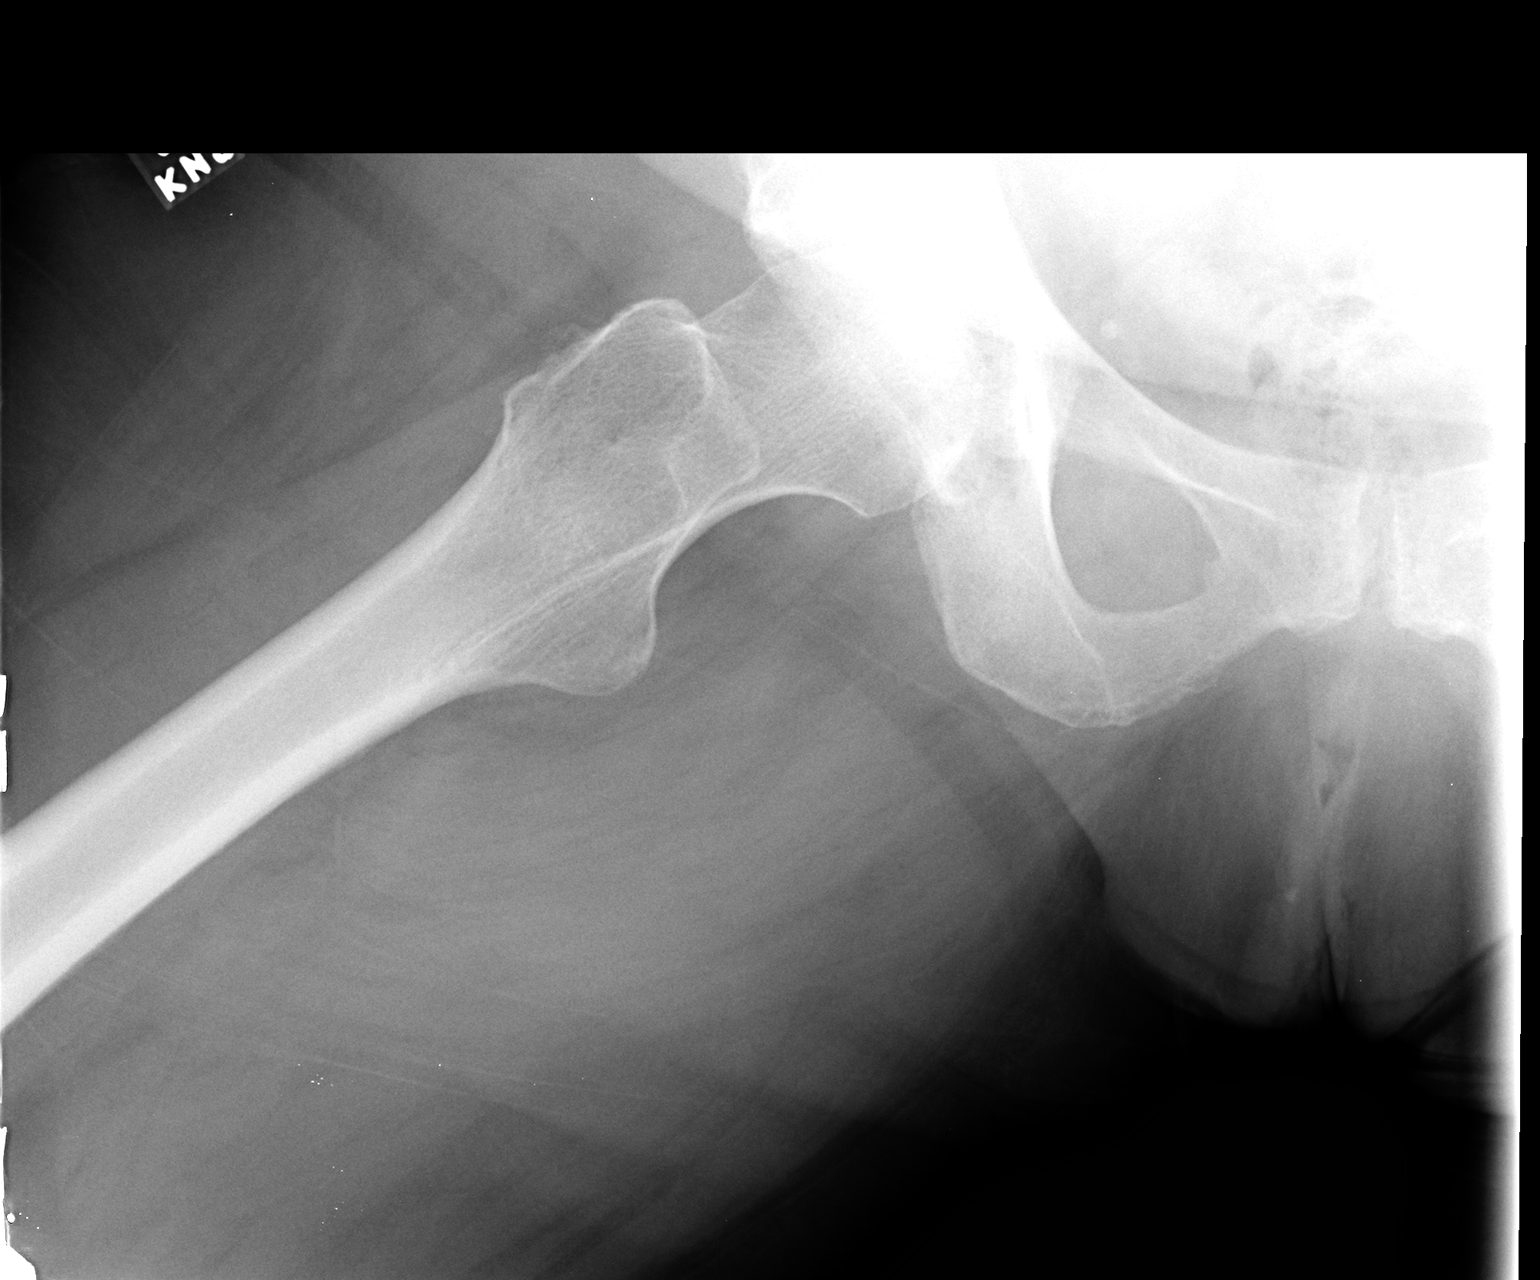

[3 of 3 positions shown; findings below may reference images not displayed]

FINDINGS: There is no evidence of hip fracture or dislocation. Degenerative
changes appreciated within the lower lumbar spine. There is minimal
hypertrophic spurring involving the humeral head and minimal
periarticular sclerosis involving the acetabulum.
IMPRESSION: No acute osseous abnormalities. Minimal osteoarthritic changes
within the hip. Degenerative changes within the lower lumbar spine.

## 2014-07-21 ENCOUNTER — Telehealth: Payer: Self-pay | Admitting: Internal Medicine

## 2014-07-21 ENCOUNTER — Ambulatory Visit: Payer: Medicare Other | Admitting: Internal Medicine

## 2014-07-21 NOTE — Telephone Encounter (Signed)
If no enlarging thyroid nodules or pbs swallowing/breathing, we can schedule in 1 year, actually

## 2014-07-21 NOTE — Telephone Encounter (Signed)
Patient no showed today's appt. Please advise on how to follow up. °A. No follow up necessary. °B. Follow up urgent. Contact patient immediately. °C. Follow up necessary. Contact patient and schedule visit in ___ days. °D. Follow up advised. Contact patient and schedule visit in ____weeks. ° °

## 2014-07-22 NOTE — Telephone Encounter (Signed)
See note below
